# Patient Record
Sex: Male | Born: 1970
Health system: Southern US, Community
[De-identification: ages and names within clinical notes are randomized; demographics above are authoritative.]

## PROBLEM LIST (undated history)

## (undated) DIAGNOSIS — T8859XA Other complications of anesthesia, initial encounter: Secondary | ICD-10-CM

## (undated) DIAGNOSIS — F909 Attention-deficit hyperactivity disorder, unspecified type: Secondary | ICD-10-CM

## (undated) DIAGNOSIS — E039 Hypothyroidism, unspecified: Secondary | ICD-10-CM

## (undated) DIAGNOSIS — C801 Malignant (primary) neoplasm, unspecified: Secondary | ICD-10-CM

## (undated) DIAGNOSIS — M1990 Unspecified osteoarthritis, unspecified site: Secondary | ICD-10-CM

## (undated) DIAGNOSIS — G4730 Sleep apnea, unspecified: Secondary | ICD-10-CM

## (undated) DIAGNOSIS — F419 Anxiety disorder, unspecified: Secondary | ICD-10-CM

## (undated) HISTORY — PX: THYROIDECTOMY: SHX17

## (undated) HISTORY — PX: EYE SURGERY: SHX253

## (undated) HISTORY — PX: PROSTATE SURGERY: SHX751

---

## 2005-07-04 HISTORY — PX: THYROIDECTOMY: SHX17

## 2012-07-23 ENCOUNTER — Inpatient Hospital Stay: Admit: 2012-07-23 | Discharge: 2012-07-23 | Attending: Emergency Medicine

## 2012-07-23 MED ORDER — PREDNISONE 10 MG PO TABS
10 MG | ORAL_TABLET | Freq: Every day | ORAL | Status: AC
Start: 2012-07-23 — End: 2012-07-28

## 2012-07-23 MED ORDER — AZITHROMYCIN 250 MG PO TABS
250 MG | PACK | ORAL | Status: DC
Start: 2012-07-23 — End: 2013-03-06

## 2012-07-23 MED ORDER — ALBUTEROL SULFATE HFA 108 (90 BASE) MCG/ACT IN AERS
108 (90 Base) MCG/ACT | Freq: Four times a day (QID) | RESPIRATORY_TRACT | Status: DC | PRN
Start: 2012-07-23 — End: 2013-03-21

## 2012-07-23 MED ADMIN — predniSONE (DELTASONE) tablet 60 mg: ORAL | @ 21:00:00 | NDC 00143973805

## 2012-07-23 MED ADMIN — azithromycin (ZITHROMAX) tablet 500 mg: ORAL | @ 13:00:00 | NDC 59762306003

## 2012-07-23 MED ADMIN — ipratropium-albuterol (DUONEB) nebulizer solution 1 ampule: RESPIRATORY_TRACT | @ 21:00:00 | NDC 00185732230

## 2012-07-23 MED FILL — PREDNISONE 20 MG PO TABS: 20 MG | ORAL | Qty: 3

## 2012-07-23 MED FILL — DUONEB 0.5-2.5 (3) MG/3ML IN SOLN: RESPIRATORY_TRACT | Qty: 3

## 2012-07-23 MED FILL — AZITHROMYCIN 250 MG PO TABS: 250 MG | ORAL | Qty: 2

## 2012-07-23 NOTE — ED Notes (Signed)
To conference room. Radiology aware.    Fulton Mole, RN  07/23/12 [phone]

## 2012-07-23 NOTE — ED Notes (Signed)
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 States family sick past 3- 4 months, states otc meds no relief no other complaints.    Gabriel Perez  07/23/12 1345

## 2012-07-23 NOTE — ED Notes (Signed)
Discharge instructions and prescriptions reviewed and provided to pt. VU an understanding, denies any questions. Discharged at this time, ambulatory, with no distress noted.      Fulton Mole, RN  07/23/12 [phone]

## 2012-07-23 NOTE — ED Provider Notes (Signed)
Gabriel Perez is a 42 y.o. male who presents to the emergency department with c/o   Location: cough, dry  Duration: 4-5 days  Severity: frequent, worsening  Exacerbating factors: activity  Alleviating factors: tried nothing  Associated factors: reports exertional shortness of breath, wheezing and body aches.  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  He states his shortness of breath and wheezing started then. He states his mother-in-law was recently sick with a cough.  He denies history of asthma or COPD.  Patient does not smoke.  He has history of sleep apnea however he does not use a machine.  Has history of thyroid cancer as well.    PULMONARY EMBOLISM RULE-OUT CRITERIA:    1. Age > 49? No  2. Pulse greater than 99/min?  No  3. Room air pulse ox <95%?  No  4. Hemoptysis?  No  5. On estrogen?  No  6. Prior diagnosis of DVT or PE?  No  7. Surgery or trauma requiring endotracheal intubation or hospitalization in past 4 wks?  No  8. Unilateral leg swelling?  No    The history is provided by the patient.       Review of Systems   Constitutional: Negative for fever, chills and fatigue.   HENT: Negative for congestion, sore throat, rhinorrhea, sneezing, trouble swallowing, neck pain, postnasal drip and tinnitus.    Eyes: Negative for visual disturbance.   Respiratory: Positive for cough, shortness of breath and wheezing. Negative for chest tightness.    Cardiovascular: Negative for chest pain and palpitations.   Gastrointestinal: Negative for nausea, vomiting and abdominal pain.   Genitourinary: Negative for dysuria.   Musculoskeletal: Positive for myalgias. Negative for back pain.   Skin: Negative.    Neurological: Negative for dizziness, syncope, weakness, numbness and headaches.   All other systems reviewed and are negative.      PAST MEDICAL HISTORY    has no past medical history on file.    PAST SURGICAL HISTORY   has no past surgical history on file.    FAMILY HISTORY  family history is not on file.    SOCIAL HISTORY       HOME MEDICATIONS     Prior to Admission medications    Not on File        ALLERGIES   has no known allergies.     Physical Exam   Nursing note and vitals reviewed.  Constitutional: He is oriented to person, place, and time. He appears well-developed and well-nourished.   HENT:   Head: Normocephalic and atraumatic. No trismus in the jaw.   Right Ear: Hearing, tympanic membrane, external ear and ear canal normal.   Left Ear: Hearing, tympanic membrane, external ear and ear canal normal.   Nose: Nose normal. No rhinorrhea. Right sinus exhibits no maxillary sinus tenderness and no frontal sinus tenderness. Left sinus exhibits no maxillary sinus tenderness and no frontal sinus tenderness.   Mouth/Throat: Uvula is midline, oropharynx is clear and moist and mucous membranes are normal. No uvula swelling.   Eyes: EOM are normal. Pupils are equal, round, and reactive to light.   Neck: Normal range of motion and full passive range of motion without pain. Neck supple. No spinous process tenderness and no muscular tenderness present. No rigidity. No edema, no erythema and normal  range of motion present.   Cardiovascular: Normal rate, regular rhythm and normal heart sounds.  Exam reveals no gallop and no friction rub.    No murmur heard.  Pulses:       Radial pulses are 2+ on the right side, and 2+ on the left side.        Dorsalis pedis pulses are 2+ on the right side, and 2+ on the left side.   Pulmonary/Chest: Effort normal. No respiratory distress. He has no decreased breath sounds. He has wheezes. He has rhonchi. He has no rales. He exhibits no tenderness.   Abdominal: Soft. Bowel sounds are normal. There is no tenderness.   Musculoskeletal: Normal range of motion. He exhibits no tenderness.   Lymphadenopathy:     He has no cervical adenopathy.    Neurological: He is alert and oriented to person, place, and time. He exhibits normal muscle tone. Coordination normal.   Skin: Skin is warm and dry. No rash noted.   Psychiatric: He has a normal mood and affect. His behavior is normal. Judgment and thought content normal.       BP 125/88   Pulse 89   Temp(Src) 98.8 ??F (37.1 ??C) (Oral)   Resp 16   Ht 6\' 2"  (1.88 m)   Wt 250 lb (113.399 kg)   BMI 32.08 kg/m2   SpO2 96%     Procedures    MDM    Medications   ipratropium-albuterol (DUONEB) nebulizer solution 1 ampule (1 ampule Inhalation Given 07/23/12 1543)   predniSONE (DELTASONE) tablet 60 mg (60 mg Oral Given 07/23/12 1543)   azithromycin (ZITHROMAX) tablet 500 mg (500 mg Oral Given 07/23/12 0800)       Labs      Radiology  Preliminary x-ray interpretation by Jackson Latino, MD and myself   independently, in absence of radiologist (Final interpretation by radiologist to follow):    Chest: was negative for infiltrate, effusion, pneumothorax, or wide mediastinum      EKG Interpretation.      Clinical Impression:  Bronchitis    Reevaluation: Lung sounds clear.  Respirations are easy and even.  Patient states that he is breathing much easier.    I discussed the x-ray results with the patient. He does not appear at this time.  Based on his symptoms of illness he will be discharged home with  Zithromax.  He is wheezing so he'll be discharged with prednisone and albuterol inhaler as well.  He is to follow up with PCP on call in one week. The patient is comfortable and agreeable to this plan of care.  I have answered patient's questions.  The patient has no further questions at this time.    I discussed the patient with Dr. Beatrice Lecher, and we are in agreement on the plan of care.    I have considered all the elements of the relevant differential diagnosis. I estimate there is LOW risk for PNEUMONIA, MENINGITIS, PERITONSILLAR ABSCESS, SEPSIS, MALIGNANT OTITIS EXTERNA, OR EPIGLOTTITIS thus I consider the discharge disposition  reasonable.  The patient and/or family and I have discussed the diagnosis and risks, and we agree with discharging home to follow-up with their primary doctor. We also discussed returning to the Emergency Department immediately if new or worsening symptoms occur. We have discussed the symptoms which are most concerning (e.g., changing or worsening breathing, vomiting, confusion, weakness, severe headache) that necessitate immediate return.    Akshay Spang is well appearing, non-toxic, and afebrile at the time  of discharge.     Please note that some or all of this chart was generated using [employer] voice recognition software. Although every effort was made to ensure the accuracy of this automated transcription, some errors in transcription may have occurred.       Gardiner Barefoot, [place]  07/23/12 [id number]

## 2012-07-23 NOTE — Discharge Instructions (Signed)
IMPORTANT:  If you have any trouble getting in to see the physician that we have referred you to today, please call the [facility] at [phone].  Please leave a voice message if they are unavailable and they will return your call.    If you were prescribed an outpatient test, please call [facility] at [phone] to schedule an appointment for your test that was ordered.       ADDITIONAL INSTRUCTIONS FOR ALL PATIENTS:  -If you have been prescribed an antibiotic TAKE IT AS DIRECTED UNTIL IT IS ALL FINISHED.  -If you HAVE RECEIVED OR BEEN PRESCRIBED A MEDICATION THAT MAY CAUSE DROWSINESS. DO NOT DRIVE, DRINK ALCOHOL, OR OPERATE MACHINERY THAT REQUIRES YOU TO BE ALERT.    -If you had an EKG and/or X-Ray reading made in the Emergency Department, it will be reviewed by a Cardiologist and/or Radiologist. If the review changes your diagnosis, you will be contacted.  -If you had a specimen collected for culture, a CULTURE REPORT takes 48-72 hours to generate: You will be contacted if a change in treatment is needed.    -Return if your condition worsens or if you have severe pain, worsening of symptoms such as fever, vomiting or difficulty breathing.    If you have been prescribed an opiate or other controlled substance (see lists below):    [place] may have been prescribed a medication that is a controlled substance.  Controlled substances include pain medications known as opiates and sedative nerve medications known as benzodiazepines.    Some common opiates include:    Codeine (such as Tylenol #3)  Hydrocodone (Vicodin, Lortab, Lorcet, Norco)  Oxycodone (Percocet, Percodan, Oxycodone, Oxy IR)    Some common benzodiazepines include:    Diazepam (Valium)  Lorazepam (Ativan)  Alprazolam (Xanax)  Clonazepam (Klonopin)  Oxazepam (Serax)    All of these controlled substances are highly addictive and frequently abused.  Misuse can and frequently does lead  to addiction as well as overdose and death.  Short term supplies, 3 days or less, are prescribed because of the highly addictive nature of the medication.  Any of the controlled substance medication NOT taken should be disposed of properly and NOT SAVED.  The recommended method of disposing of unused medications is:       Place the medicines in a sealable plastic bag.  If the medicine is a solid, crush it or add water to dissolve it.    Add something undesirable (cat litter, coffee grounds, etc.)    Dispose of sealed bag in household trash    Do not flush or pour unused medicines down a sink or drain.    Also, because of the addictive nature and frequent abuse, these medications are sometimes stolen.  These medications should be kept in a safe place where they cannot be stolen.  Do not keep them in your car or purse.  Lost or stolen prescriptions for controlled substances WILL NOT BE REFILLED in this emergency department, regardless of whether a police report was filed.        Antibiotic Medication  Antibiotics are among the most frequently prescribed medicines. Antibiotics cure illness by assisting our body to injure or kill the bacteria that cause infection. While antibiotics are useful to treat a wide variety of infections they are useless against viruses. Antibiotics cannot cure colds, flu, or other viral infections.   There are many types of antibiotics available. Your caregiver will  decide which antibiotic will be useful for an illness. Never take or give someone else's antibiotics or left over medicine.  Your caregiver may also take into account:   Allergies.   The cost of the medicine.   Dosing schedules.   Taste.   Common side effects when choosing an antibiotic for an infection.  Ask your caregiver if you have questions about why a certain medicine was chosen.  HOME CARE INSTRUCTIONS  Read all instructions and labels on medicine bottles carefully. Some antibiotics should be taken on an empty stomach  while others should be taken with food. Taking antibiotics incorrectly may reduce how well they work. Some antibiotics need to be kept in the refrigerator. Others should be kept at room temperature. Ask your caregiver or pharmacist if you do not understand how to give the medicine.  Be sure to give the amount of medicine your caregiver has prescribed. Even if you feel better and your symptoms improve, bacteria may still remain alive in the body. Taking all of the medicine will prevent:   The infection from returning and becoming harder to treat.   Complications from partially treated infections.  If there is any medicine left over after you have taken the medicine as your caregiver has instructed, throw the medicine away.  Be sure to tell your caregiver if you:   Are allergic to any medicines.   Are pregnant or intend to become pregnant while using this medicine.   Are breastfeeding.   Are taking any other prescription, non-prescription medicine, or herbal remedies.   Have any other medical conditions or problems you have not already discussed.  If you are taking birth control pills, they may not work while you are on antibiotics. To avoid unwanted pregnancy:   Continue taking your birth control pills as usual.   Use a second form of birth control (such as condoms) while you are taking antibiotic medicine.   When you finish taking the antibiotic medicine, continue using the second form of birth control until you are finished with your current 1 month cycle of birth control pills.  Try not to miss any doses of medicine. If you miss a dose, take it as soon as possible. However, if it is almost time for the next dose and the dosing schedule is:   2 doses a day, take the missed dose and the next dose 5 to 6 hours apart.   3 or more doses a day, take the missed dose and the next dose 2 to 4 hours apart, then go back to the normal schedule.   If you are unable to make up a missed dose, take the next scheduled  dose on time and complete the missed dose at the end of the prescribed time for your medicine.  SIDE EFFECTS TO TAKING ANTIBIOTICS  Common side effects to antibiotic use include:   Soft stools or diarrhea.   Mild stomach upset.   Sun sensitivity.  SEEK MEDICAL CARE IF:    If you get worse or do not improve within a few days of starting the medicine.   Vomiting develops.   Diaper rash or rash on the genitals appears.   Vaginal itching occurs.   White patches appear on the tongue or in the mouth.   Severe watery diarrhea and abdominal cramps occur.   Signs of an allergy develop (trouble breathing, wheezing, hives, unknown itchy rash appears, swelling of the lips, face or tongue, fainting, or blisters on the skin or in  the mouth). STOP TAKING THE ANTIBIOTIC.  SEEK IMMEDIATE MEDICAL CARE IF:    Urine turns dark or blood colored.   Skin turns yellow.   Easy bruising or bleeding occurs.   Joint pain or muscle aches occur.   Fever returns.   Severe headache occurs.  Document Released: 03/02/2004 Document Revised: 09/12/2011 Document Reviewed: 03/12/2009      Pneumonia, Adult  Pneumonia is an infection of the lungs.   CAUSES  Pneumonia may be caused by bacteria or a virus. Usually, these infections are caused by breathing infectious particles into the lungs (respiratory tract).  SYMPTOMS    Cough.   Fever.   Chest pain.   Increased rate of breathing.   Wheezing.   Mucus production.  DIAGNOSIS   If you have the common symptoms of pneumonia, your caregiver will typically confirm the diagnosis with a chest X-ray. The X-ray will show an abnormality in the lung (pulmonary infiltrate) if you have pneumonia. Other tests of your blood, urine, or sputum may be done to find the specific cause of your pneumonia. Your caregiver may also do tests (blood gases or pulse oximetry) to see how well your lungs are working.  TREATMENT   Some forms of pneumonia may be spread to other people when you cough or sneeze. You  may be asked to wear a mask before and during your exam. Pneumonia that is caused by bacteria is treated with antibiotic medicine. Pneumonia that is caused by the influenza virus may be treated with an antiviral medicine. Most other viral infections must run their course. These infections will not respond to antibiotics.   PREVENTION  A pneumococcal shot (vaccine) is available to prevent a common bacterial cause of pneumonia. This is usually suggested for:   People over 21 years old.   Patients on chemotherapy.   People with chronic lung problems, such as bronchitis or emphysema.   People with immune system problems.  If you are over 65 or have a high risk condition, you may receive the pneumococcal vaccine if you have not received it before. In some countries, a routine influenza vaccine is also recommended. This vaccine can help prevent some cases of pneumonia.You may be offered the influenza vaccine as part of your care.  If you smoke, it is time to quit. You may receive instructions on how to stop smoking. Your caregiver can provide medicines and counseling to help you quit.  HOME CARE INSTRUCTIONS    Cough suppressants may be used if you are losing too much rest. However, coughing protects you by clearing your lungs. You should avoid using cough suppressants if you can.   Your caregiver may have prescribed medicine if he or she thinks your pneumonia is caused by a bacteria or influenza. Finish your medicine even if you start to feel better.   Your caregiver may also prescribe an expectorant. This loosens the mucus to be coughed up.   Only take over-the-counter or prescription medicines for pain, discomfort, or fever as directed by your caregiver.   Do not smoke. Smoking is a common cause of bronchitis and can contribute to pneumonia. If you are a smoker and continue to smoke, your cough may last several weeks after your pneumonia has cleared.   A cold steam vaporizer or humidifier in your room or  home may help loosen mucus.   Coughing is often worse at night. Sleeping in a semi-upright position in a recliner or using a couple pillows under your head will help  with this.   Get rest as you feel it is needed. Your body will usually let you know when you need to rest.  SEEK IMMEDIATE MEDICAL CARE IF:    Your illness becomes worse. This is especially true if you are elderly or weakened from any other disease.   You cannot control your cough with suppressants and are losing sleep.   You begin coughing up blood.   You develop pain which is getting worse or is uncontrolled with medicines.   You have a fever.   Any of the symptoms which initially brought you in for treatment are getting worse rather than better.   You develop shortness of breath or chest pain.  MAKE SURE YOU:    Understand these instructions.   Will watch your condition.   Will get help right away if you are not doing well or get worse.  Document Released: 06/20/2005 Document Revised: 09/12/2011 Document Reviewed: 09/09/2010  [facility] Patient Information 2013 [place], [place].      [employer] Patient Information 2013 [place], [place].

## 2012-07-23 NOTE — ED Notes (Signed)
The patient was seen by me in conjunction with a mid-level provider (nurse practitioner or physician assistant).  I have personally performed and/or participated in the history, exam and medical decision making and agree with all pertinent clinical information.  I have also reviewed and agree with the past medical, family and social history unless otherwise noted. All lab results, EKG tracings, and radiographic studies or interpretations were reviewed by me.    I have personally performed a face-to-face diagnostic evaluation on the patient.  My findings are as follows:    History:  Patient presents to the ED with a chief complaint of URI. 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 He reports sick contacts at home.      Exam shows:  Diffuse wheezing. Normal heart sounds. alert, oriented, nontoxic.abdomen soft nontender. Oropharynx clear. Well-appearing nontoxic.    This document serves as a record of the decisions personally performed by the attending provider, Dr. Jackson Latino, MD. It was created on their behalf by Lucy Chris, a trained medical scribe. The creation of this document is based on the provider's statements to the medical scribe.    This dictation was generated by voice recognition computer software. In addition, part of the chart was also generated automatically by the EMR . Although all attempts are made to edit the dictation for accuracy, there may be errors in the transcription and/or computer generation that were not intended.  Scribe Authentication: All medical record entries made by the scribe were at my direction. I have reviewed the chart and agree that the record accurately reflects the my work and the decisions made by me, Linus Galas M.D.      Jackson Latino, MD  07/23/12 [phone]

## 2013-03-06 MED ORDER — LEVOTHYROXINE SODIUM 175 MCG PO TABS
175 MCG | ORAL_TABLET | Freq: Every day | ORAL | Status: DC
Start: 2013-03-06 — End: 2013-03-22

## 2013-03-06 NOTE — Progress Notes (Signed)
Subjective:      Patient ID: Gabriel Perez is a 42 y.o. male.    Other  This is a new problem. The current episode started today (New to area and ran out of Synthroid yesterday. Requesting med refill until appt. with new pcp on 03/22/13. No complaints.). Pertinent negatives include no abdominal pain, arthralgias, change in bowel habit, chest pain, chills, congestion, coughing, fatigue, fever, headaches, myalgias, nausea, numbness, rash, sore throat, vomiting or weakness.       Review of Systems   Constitutional: Negative for fever, chills and fatigue.   HENT: Negative for ear pain, congestion, sore throat, rhinorrhea and trouble swallowing.    Eyes: Negative for pain and discharge.   Respiratory: Negative for cough, shortness of breath and wheezing.    Cardiovascular: Negative for chest pain.   Gastrointestinal: Negative for nausea, vomiting, abdominal pain, diarrhea and change in bowel habit.   Genitourinary: Negative for dysuria, urgency, frequency and difficulty urinating.   Musculoskeletal: Negative for myalgias and arthralgias.   Skin: Negative for rash and wound.   Neurological: Negative for weakness, numbness and headaches.   Hematological: Does not bruise/bleed easily.   All other systems reviewed and are negative.        Objective:   Physical Exam   Constitutional: He is oriented to person, place, and time. He appears well-developed and well-nourished. No distress.   HENT:   Head: Normocephalic and atraumatic.   Right Ear: Tympanic membrane, external ear and ear canal normal.   Left Ear: Tympanic membrane, external ear and ear canal normal.   Mouth/Throat: Oropharynx is clear and moist. No oropharyngeal exudate.   Eyes: Conjunctivae and EOM are normal. Pupils are equal, round, and reactive to light. No scleral icterus.   Neck: Normal range of motion. Neck supple. No tracheal deviation present. No thyromegaly present.   Cardiovascular: Normal rate, regular rhythm and normal heart sounds.  Exam reveals no  gallop and no friction rub.    No murmur heard.  Pulmonary/Chest: Effort normal and breath sounds normal. No respiratory distress. He has no wheezes. He has no rales. He exhibits no tenderness.   Abdominal: Soft. Bowel sounds are normal. He exhibits no distension and no mass. There is no tenderness. There is no rebound and no guarding.   Musculoskeletal: Normal range of motion. He exhibits no edema or tenderness.   Lymphadenopathy:     He has no cervical adenopathy.   Neurological: He is alert and oriented to person, place, and time. He has normal strength. No sensory deficit.   Skin: Skin is warm and dry. No lesion and no rash noted.   Psychiatric: He has a normal mood and affect. His behavior is normal.   Vitals reviewed.      Assessment:      1. Thyroid cancer (HCC)    2. Medication refill    3. S/P thyroidectomy               Plan:      [place] was seen today for medication problem.    Diagnoses and associated orders for this visit:    Thyroid cancer (HCC)    Medication refill    S/P thyroidectomy    Other Orders  - levothyroxine (LEVOTHROID) 175 MCG tablet; Take 1 tablet by mouth Daily.

## 2013-03-21 MED ORDER — SYNTHROID 175 MCG PO TABS
175 MCG | ORAL_TABLET | Freq: Every day | ORAL | Status: DC
Start: 2013-03-21 — End: 2013-06-04

## 2013-03-21 NOTE — Progress Notes (Signed)
Subjective:      Patient ID: Gabriel Perez is a 42 y.o. male.    HPI: 42 y.o. in for   Chief Complaint   Patient presents with   ??? Establish Care     moved here from [facility], seen only 1x or Urgent Care    well visit: doing well no issues  Trying to exercise, eats well    Needs refills.  Hx of papillary thyroid cancer.  S/p excision.  Been a while since his last tsh check.  On synthroid.  Working out, gaining weight (slowly over time) but has been having a hard time losing weight.          Past Medical History   Diagnosis Date   ??? Thyroid cancer (HCC) 2007   ??? H/O thyroidectomy 2007   ??? Lipoma        Current Outpatient Prescriptions   Medication Sig Dispense Refill   ??? levothyroxine (LEVOTHROID) 175 MCG tablet Take 1 tablet by mouth Daily.  30 tablet  0     No current facility-administered medications for this visit.       No Known Allergies    Past Surgical History   Procedure Laterality Date   ??? Thyroidectomy         Family History   Problem Relation Age of Onset   ??? Asthma Maternal Grandmother    ??? Heart Disease Maternal Grandmother    ??? Cancer Maternal Grandfather      Lung, smoker       History     Social History   ??? Marital Status: Married     Spouse Name: N/A     Number of Children: N/A   ??? Years of Education: N/A     Occupational History   ??? Not on file.     Social History Main Topics   ??? Smoking status: Never Smoker    ??? Smokeless tobacco: Never Used   ??? Alcohol Use: No      Comment: maybe 1 beer every few months    ??? Drug Use: No   ??? Sexual Activity: Not on file     Other Topics Concern   ??? 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   HENT: Negative for hearing loss, ear pain and postnasal drip.    Eyes: Negative for discharge.   Respiratory: Negative for cough, chest tightness and shortness of breath.     Cardiovascular: Negative for chest pain and palpitations.   Gastrointestinal: Negative for abdominal pain, diarrhea, constipation, blood in stool, abdominal distention and anal bleeding.   Genitourinary: Negative for dysuria and difficulty urinating.   Musculoskeletal: Negative.    Skin: Negative for rash.       Objective:   Physical Exam   Constitutional: He appears well-developed and well-nourished. No distress.   HENT:   Head: Normocephalic and atraumatic.   Mouth/Throat: No oropharyngeal exudate.   Eyes: Conjunctivae and EOM are normal. Right eye exhibits no discharge. Left eye exhibits no discharge. No scleral icterus.   Neck: Normal range of motion. Neck supple. No tracheal deviation present. No thyromegaly present.   Cardiovascular: Normal rate, regular rhythm and normal heart sounds.  Exam reveals no gallop and no friction rub.    No murmur heard.  Pulmonary/Chest: Effort normal and breath sounds normal. No respiratory distress. He has no wheezes. He has no rales. He exhibits no tenderness.   Abdominal: Soft. Bowel sounds are normal. He exhibits no distension and no mass. There is no tenderness. There is no rebound and no guarding.   Lymphadenopathy:     He has no cervical adenopathy.   Skin: No rash noted. He is not diaphoretic.   Nursing note and vitals reviewed.      Assessment:      Well visit      Plan:       Healthy living advice given  bp looks good  Work on weight loss (check tsh, adjust synthroid if needed)    Gabriel Perez was seen today for establish care.    Diagnoses and associated orders for this visit:    Well adult exam  - LIPID PANEL  - Comprehensive Metabolic Panel    Hypothyroidism  - TSH without Reflex    Need for Tdap vaccination  - Tdap vaccine greater than or equal to 7yo IM    Other Orders  - SYNTHROID 175 MCG tablet; Take 1 tablet by mouth Daily.

## 2013-03-22 LAB — LIPID PANEL
Cholesterol, Total: 197 mg/dL (ref 0–199)
HDL: 39 mg/dL — ABNORMAL LOW (ref 40–60)
LDL Calculated: 128 mg/dL — ABNORMAL HIGH (ref <100)
Triglycerides: 151 mg/dL — ABNORMAL HIGH (ref 0–150)
VLDL Cholesterol Calculated: 30 mg/dL

## 2013-03-22 LAB — COMPREHENSIVE METABOLIC PANEL
ALT: 29 U/L (ref 10–40)
AST: 21 U/L (ref 15–37)
Albumin/Globulin Ratio: 2.3 — ABNORMAL HIGH (ref 1.1–2.2)
Albumin: 4.8 g/dL (ref 3.4–5.0)
Alkaline Phosphatase: 71 U/L (ref 40–129)
BUN: 14 mg/dL (ref 7–20)
CO2: 25 mmol/L (ref 21–32)
Calcium: 9.5 mg/dL (ref 8.3–10.6)
Chloride: 103 mmol/L (ref 99–110)
Creatinine: 1 mg/dL (ref 0.9–1.3)
GFR African American: >60 (ref >60)
GFR Non-African American: >60 (ref >60)
Globulin: 2.1 g/dL
Glucose: 95 mg/dL (ref 70–99)
Potassium: 4.1 mmol/L (ref 3.5–5.1)
Sodium: 140 mmol/L (ref 136–145)
Total Bilirubin: 0.7 mg/dL (ref 0.0–1.0)
Total Protein: 6.9 g/dL (ref 6.4–8.2)

## 2013-03-22 LAB — TSH: TSH: 7.51 u[IU]/mL — ABNORMAL HIGH (ref 0.27–4.20)

## 2013-03-22 MED ORDER — SYNTHROID 200 MCG PO TABS
200 MCG | ORAL_TABLET | Freq: Every day | ORAL | Status: DC
Start: 2013-03-22 — End: 2013-07-18

## 2013-06-04 MED ORDER — TRIAMCINOLONE ACETONIDE 0.1 % EX OINT
0.1 % | CUTANEOUS | Status: AC
Start: 2013-06-04 — End: 2013-06-11

## 2013-06-04 MED ORDER — PREDNISONE 10 MG PO TABS
10 MG | ORAL_TABLET | ORAL | Status: AC
Start: 2013-06-04 — End: ?

## 2013-06-04 MED ADMIN — methylPREDNISolone sodium (SOLU-MEDROL) injection 40 mg: INTRAVENOUS | @ 18:00:00 | NDC 00009003930

## 2013-06-04 NOTE — Progress Notes (Signed)
Subjective:      Patient ID: Gabriel Perez is a 42 y.o. male.    HPI Comments: Pt burned limbs on tree waste/leaves 1 week ago    Pt is very allergic to poison ivy    Rash  This is a new problem. The current episode started in the past 7 days. The problem has been gradually worsening since onset. The rash is diffuse. The rash is characterized by blistering, itchiness and redness. He was exposed to plant contact. Pertinent negatives include no congestion, cough, diarrhea, eye pain, facial edema, fatigue, fever, joint pain, nail changes, rhinorrhea, shortness of breath, sore throat or vomiting. Past treatments include nothing. The treatment provided no relief. There is no history of allergies, asthma, eczema or varicella.       Review of Systems   Constitutional: Negative for fever and fatigue.   HENT: Negative for congestion, rhinorrhea and sore throat.    Eyes: Negative for pain.   Respiratory: Negative for cough and shortness of breath.    Cardiovascular: Negative for chest pain.   Gastrointestinal: Negative for vomiting and diarrhea.   Musculoskeletal: Negative for joint pain.   Skin: Positive for rash. Negative for nail changes.       Objective:   Physical Exam   Constitutional: He appears well-developed and well-nourished. No distress.   HENT:   Head: Normocephalic and atraumatic.   Mouth/Throat: No oropharyngeal exudate.   Eyes: Conjunctivae and EOM are normal. Right eye exhibits no discharge. Left eye exhibits no discharge. No scleral icterus.   Neck: Normal range of motion. Neck supple. No tracheal deviation present. No thyromegaly present.   Cardiovascular: Normal rate, regular rhythm and normal heart sounds.  Exam reveals no gallop and no friction rub.    No murmur heard.  Pulmonary/Chest: Effort normal and breath sounds normal. No respiratory distress. He has no wheezes. He has no rales. He exhibits no tenderness.   Lungs CTAB   Lymphadenopathy:     He has no cervical adenopathy.   Skin: Rash (diffuse  vesicular rash.) noted. He is not diaphoretic.   Nursing note and vitals reviewed.      Assessment:      Poison ivy       Plan:      Don't be involved with burning yard waste any more.    Steroids (solumedrol x 1 today, then prednisone taper; topical triamcinolone prn)  Benadryl as needed    Franky was seen today for rash.    Diagnoses and associated orders for this visit:    Poison ivy dermatitis    Hypothyroid  - TSH with Reflex    Other Orders  - triamcinolone (KENALOG) 0.1 % ointment; Apply topically 2 times daily.  - predniSONE (DELTASONE) 10 MG tablet; Take 4 tabs po daily x 3 days, then 3 tabs po daily x 3 days, then 2 tabs po daily x 3 days, then 1 tab po daily x 3 days, then stop.  - methylPREDNISolone sodium (SOLU-MEDROL) injection 40 mg; Infuse 1 mL intravenously once.

## 2013-06-05 LAB — TSH WITH REFLEX: TSH: 0.42 u[IU]/mL (ref 0.27–4.20)

## 2013-07-18 NOTE — Telephone Encounter (Signed)
Last Seen: 06/04/13

## 2013-07-23 MED ORDER — SYNTHROID 200 MCG PO TABS
200 MCG | ORAL_TABLET | ORAL | Status: DC
Start: 2013-07-23 — End: 2013-10-19

## 2013-10-21 NOTE — Telephone Encounter (Signed)
Last OV 06/04/13  Last tsh 06/04/13

## 2013-10-22 MED ORDER — SYNTHROID 200 MCG PO TABS
200 MCG | ORAL_TABLET | ORAL | Status: DC
Start: 2013-10-22 — End: 2013-11-28

## 2013-11-28 NOTE — Telephone Encounter (Signed)
Last seen 06/04/13  Last labs 06/04/13

## 2013-11-30 MED ORDER — SYNTHROID 200 MCG PO TABS
200 MCG | ORAL_TABLET | Freq: Every day | ORAL | Status: DC
Start: 2013-11-30 — End: 2014-04-14

## 2014-04-14 MED ORDER — SYNTHROID 200 MCG PO TABS
200 MCG | ORAL_TABLET | ORAL | Status: DC
Start: 2014-04-14 — End: 2014-06-11

## 2014-04-14 NOTE — Telephone Encounter (Signed)
Last seen 06/04/13

## 2014-06-11 MED ORDER — SYNTHROID 200 MCG PO TABS
200 MCG | ORAL_TABLET | Freq: Every day | ORAL | Status: DC
Start: 2014-06-11 — End: 2014-07-12

## 2014-06-11 NOTE — Telephone Encounter (Signed)
Verbal ok per Johnston. Must make appointment before any further refills.

## 2014-07-14 MED ORDER — SYNTHROID 200 MCG PO TABS
200 MCG | ORAL_TABLET | ORAL | Status: DC
Start: 2014-07-14 — End: 2014-09-06

## 2014-07-14 NOTE — Telephone Encounter (Signed)
Verbal ok per Johnston. Must make appointment before any further refills.

## 2014-09-08 MED ORDER — SYNTHROID 200 MCG PO TABS
200 MCG | ORAL_TABLET | Freq: Every day | ORAL | Status: AC
Start: 2014-09-08 — End: ?

## 2014-09-08 NOTE — Telephone Encounter (Signed)
Verbal ok per Johnston. Must make appointment before any further refills.

## 2018-08-01 DIAGNOSIS — G4733 Obstructive sleep apnea (adult) (pediatric): Secondary | ICD-10-CM | POA: Diagnosis not present

## 2018-08-14 DIAGNOSIS — G4733 Obstructive sleep apnea (adult) (pediatric): Secondary | ICD-10-CM | POA: Diagnosis not present

## 2018-08-16 DIAGNOSIS — J111 Influenza due to unidentified influenza virus with other respiratory manifestations: Secondary | ICD-10-CM | POA: Diagnosis not present

## 2018-09-11 DIAGNOSIS — M25561 Pain in right knee: Secondary | ICD-10-CM | POA: Diagnosis not present

## 2018-09-11 DIAGNOSIS — M25461 Effusion, right knee: Secondary | ICD-10-CM | POA: Diagnosis not present

## 2018-09-12 DIAGNOSIS — G4733 Obstructive sleep apnea (adult) (pediatric): Secondary | ICD-10-CM | POA: Diagnosis not present

## 2018-09-14 DIAGNOSIS — M25561 Pain in right knee: Secondary | ICD-10-CM | POA: Insufficient documentation

## 2018-10-13 DIAGNOSIS — G4733 Obstructive sleep apnea (adult) (pediatric): Secondary | ICD-10-CM | POA: Diagnosis not present

## 2018-11-12 DIAGNOSIS — G4733 Obstructive sleep apnea (adult) (pediatric): Secondary | ICD-10-CM | POA: Diagnosis not present

## 2018-11-14 DIAGNOSIS — F320 Major depressive disorder, single episode, mild: Secondary | ICD-10-CM | POA: Diagnosis not present

## 2018-11-14 DIAGNOSIS — E039 Hypothyroidism, unspecified: Secondary | ICD-10-CM | POA: Diagnosis not present

## 2018-12-13 DIAGNOSIS — G4733 Obstructive sleep apnea (adult) (pediatric): Secondary | ICD-10-CM | POA: Diagnosis not present

## 2019-01-08 DIAGNOSIS — F329 Major depressive disorder, single episode, unspecified: Secondary | ICD-10-CM | POA: Diagnosis not present

## 2019-01-08 DIAGNOSIS — F902 Attention-deficit hyperactivity disorder, combined type: Secondary | ICD-10-CM | POA: Diagnosis not present

## 2019-01-12 DIAGNOSIS — G4733 Obstructive sleep apnea (adult) (pediatric): Secondary | ICD-10-CM | POA: Diagnosis not present

## 2019-01-16 DIAGNOSIS — F329 Major depressive disorder, single episode, unspecified: Secondary | ICD-10-CM | POA: Diagnosis not present

## 2019-01-16 DIAGNOSIS — F902 Attention-deficit hyperactivity disorder, combined type: Secondary | ICD-10-CM | POA: Diagnosis not present

## 2019-02-12 DIAGNOSIS — G4733 Obstructive sleep apnea (adult) (pediatric): Secondary | ICD-10-CM | POA: Diagnosis not present

## 2019-02-21 DIAGNOSIS — F902 Attention-deficit hyperactivity disorder, combined type: Secondary | ICD-10-CM | POA: Diagnosis not present

## 2019-02-21 DIAGNOSIS — F329 Major depressive disorder, single episode, unspecified: Secondary | ICD-10-CM | POA: Diagnosis not present

## 2019-03-08 DIAGNOSIS — Z1159 Encounter for screening for other viral diseases: Secondary | ICD-10-CM | POA: Diagnosis not present

## 2019-03-15 DIAGNOSIS — G4733 Obstructive sleep apnea (adult) (pediatric): Secondary | ICD-10-CM | POA: Diagnosis not present

## 2019-04-14 DIAGNOSIS — G4733 Obstructive sleep apnea (adult) (pediatric): Secondary | ICD-10-CM | POA: Diagnosis not present

## 2019-05-09 DIAGNOSIS — R41840 Attention and concentration deficit: Secondary | ICD-10-CM | POA: Diagnosis not present

## 2019-05-09 DIAGNOSIS — M25561 Pain in right knee: Secondary | ICD-10-CM | POA: Diagnosis not present

## 2019-05-15 DIAGNOSIS — G4733 Obstructive sleep apnea (adult) (pediatric): Secondary | ICD-10-CM | POA: Diagnosis not present

## 2019-05-21 DIAGNOSIS — M1711 Unilateral primary osteoarthritis, right knee: Secondary | ICD-10-CM | POA: Diagnosis not present

## 2019-05-21 DIAGNOSIS — M25561 Pain in right knee: Secondary | ICD-10-CM | POA: Diagnosis not present

## 2019-06-07 DIAGNOSIS — F900 Attention-deficit hyperactivity disorder, predominantly inattentive type: Secondary | ICD-10-CM | POA: Diagnosis not present

## 2019-06-13 DIAGNOSIS — F900 Attention-deficit hyperactivity disorder, predominantly inattentive type: Secondary | ICD-10-CM | POA: Diagnosis not present

## 2019-06-13 DIAGNOSIS — E039 Hypothyroidism, unspecified: Secondary | ICD-10-CM | POA: Diagnosis not present

## 2019-07-01 DIAGNOSIS — G4733 Obstructive sleep apnea (adult) (pediatric): Secondary | ICD-10-CM | POA: Diagnosis not present

## 2019-07-04 DIAGNOSIS — G4733 Obstructive sleep apnea (adult) (pediatric): Secondary | ICD-10-CM | POA: Diagnosis not present

## 2019-07-16 DIAGNOSIS — F900 Attention-deficit hyperactivity disorder, predominantly inattentive type: Secondary | ICD-10-CM | POA: Diagnosis not present

## 2019-07-16 DIAGNOSIS — E559 Vitamin D deficiency, unspecified: Secondary | ICD-10-CM | POA: Diagnosis not present

## 2019-07-16 DIAGNOSIS — R252 Cramp and spasm: Secondary | ICD-10-CM | POA: Diagnosis not present

## 2019-07-16 DIAGNOSIS — E039 Hypothyroidism, unspecified: Secondary | ICD-10-CM | POA: Diagnosis not present

## 2019-08-07 DIAGNOSIS — Z13220 Encounter for screening for lipoid disorders: Secondary | ICD-10-CM | POA: Diagnosis not present

## 2019-08-07 DIAGNOSIS — R7309 Other abnormal glucose: Secondary | ICD-10-CM | POA: Diagnosis not present

## 2019-08-07 DIAGNOSIS — R5383 Other fatigue: Secondary | ICD-10-CM | POA: Diagnosis not present

## 2019-08-07 DIAGNOSIS — E559 Vitamin D deficiency, unspecified: Secondary | ICD-10-CM | POA: Diagnosis not present

## 2019-09-17 DIAGNOSIS — M791 Myalgia, unspecified site: Secondary | ICD-10-CM | POA: Diagnosis not present

## 2019-09-17 DIAGNOSIS — R519 Headache, unspecified: Secondary | ICD-10-CM | POA: Diagnosis not present

## 2019-09-17 DIAGNOSIS — M549 Dorsalgia, unspecified: Secondary | ICD-10-CM | POA: Diagnosis not present

## 2019-09-18 ENCOUNTER — Emergency Department (HOSPITAL_COMMUNITY)
Admission: EM | Admit: 2019-09-18 | Discharge: 2019-09-18 | Disposition: A | Discharge status: Home / Self Care | Payer: BC Managed Care – PPO | Attending: Emergency Medicine | Admitting: Emergency Medicine

## 2019-09-18 ENCOUNTER — Other Ambulatory Visit: Payer: Self-pay

## 2019-09-18 ENCOUNTER — Encounter (HOSPITAL_COMMUNITY): Payer: Self-pay | Admitting: Emergency Medicine

## 2019-09-18 ENCOUNTER — Emergency Department (HOSPITAL_COMMUNITY): Payer: BC Managed Care – PPO

## 2019-09-18 DIAGNOSIS — Z20822 Contact with and (suspected) exposure to covid-19: Secondary | ICD-10-CM | POA: Insufficient documentation

## 2019-09-18 DIAGNOSIS — R319 Hematuria, unspecified: Secondary | ICD-10-CM | POA: Diagnosis not present

## 2019-09-18 DIAGNOSIS — Z85850 Personal history of malignant neoplasm of thyroid: Secondary | ICD-10-CM | POA: Diagnosis not present

## 2019-09-18 DIAGNOSIS — R5381 Other malaise: Secondary | ICD-10-CM | POA: Diagnosis not present

## 2019-09-18 DIAGNOSIS — R109 Unspecified abdominal pain: Secondary | ICD-10-CM | POA: Diagnosis not present

## 2019-09-18 DIAGNOSIS — R509 Fever, unspecified: Secondary | ICD-10-CM | POA: Diagnosis not present

## 2019-09-18 DIAGNOSIS — N200 Calculus of kidney: Secondary | ICD-10-CM | POA: Diagnosis not present

## 2019-09-18 HISTORY — DX: Malignant (primary) neoplasm, unspecified: C80.1

## 2019-09-18 LAB — BASIC METABOLIC PANEL
Anion gap: 14 (ref 5–15)
BUN: 10 mg/dL (ref 6–20)
CO2: 24 mmol/L (ref 22–32)
Calcium: 9.3 mg/dL (ref 8.9–10.3)
Chloride: 101 mmol/L (ref 98–111)
Creatinine, Ser: 1.24 mg/dL (ref 0.61–1.24)
GFR calc Af Amer: >60 mL/min (ref >60)
GFR calc non Af Amer: >60 mL/min (ref >60)
Glucose, Bld: 119 mg/dL — ABNORMAL HIGH (ref 70–99)
Potassium: 3.6 mmol/L (ref 3.5–5.1)
Sodium: 139 mmol/L (ref 135–145)

## 2019-09-18 LAB — URINALYSIS, ROUTINE W REFLEX MICROSCOPIC
Bacteria, UA: NONE SEEN
Bilirubin Urine: NEGATIVE
Glucose, UA: NEGATIVE mg/dL
Ketones, ur: NEGATIVE mg/dL
Leukocytes,Ua: NEGATIVE
Nitrite: NEGATIVE
Protein, ur: NEGATIVE mg/dL
Specific Gravity, Urine: 1.002 — ABNORMAL LOW (ref 1.005–1.030)
pH: 8 (ref 5.0–8.0)

## 2019-09-18 LAB — CBC
HCT: 45.7 % (ref 39.0–52.0)
Hemoglobin: 15.4 g/dL (ref 13.0–17.0)
MCH: 28.9 pg (ref 26.0–34.0)
MCHC: 33.7 g/dL (ref 30.0–36.0)
MCV: 85.7 fL (ref 80.0–100.0)
Platelets: 244 10*3/uL (ref 150–400)
RBC: 5.33 MIL/uL (ref 4.22–5.81)
RDW: 12.9 % (ref 11.5–15.5)
WBC: 12.6 10*3/uL — ABNORMAL HIGH (ref 4.0–10.5)
nRBC: 0 % (ref 0.0–0.2)

## 2019-09-18 LAB — RESPIRATORY PANEL BY RT PCR (FLU A&B, COVID)
Influenza A by PCR: NEGATIVE
Influenza B by PCR: NEGATIVE
SARS Coronavirus 2 by RT PCR: NEGATIVE

## 2019-09-18 LAB — TSH: TSH: 0.732 u[IU]/mL (ref 0.350–4.500)

## 2019-09-18 MED ORDER — SODIUM CHLORIDE 0.9 % IV BOLUS
1000.0000 mL | Freq: Once | INTRAVENOUS | Status: AC
  Administered 2019-09-18: 1000 mL via INTRAVENOUS

## 2019-09-18 MED ORDER — CIPROFLOXACIN HCL 500 MG PO TABS: 500.0000 mg | ORAL_TABLET | Freq: Two times a day (BID) | ORAL | 0 refills | Status: DC

## 2019-09-18 MED ORDER — ACETAMINOPHEN 500 MG PO TABS
1000.0000 mg | ORAL_TABLET | Freq: Once | ORAL | Status: AC
  Administered 2019-09-18: 1000 mg via ORAL
  Filled 2019-09-18: qty 2

## 2019-09-18 MED ORDER — CIPROFLOXACIN HCL 500 MG PO TABS
500.0000 mg | ORAL_TABLET | Freq: Once | ORAL | Status: AC
  Administered 2019-09-18: 500 mg via ORAL
  Filled 2019-09-18: qty 1

## 2019-09-18 NOTE — ED Notes (Signed)
Pt resting in bed. Pt denies new or worsening complaints. Will continue to monitor. No distress noted. Pt on continuous monitoring via blood pressure, pulse ox, and cardiac monitor.  

## 2019-09-18 NOTE — ED Notes (Signed)
Pt was discharged from the ED. Pt read and understood discharge paperwork. Pt had vital signs completed. Pt conscious, breathing, and A&Ox4. No distress noted. Pt speaking in complete sentences. Pt ambulated out of the ED with a smooth and steady gait. E-signature not available.  

## 2019-09-18 NOTE — ED Triage Notes (Signed)
C/o bilateral flank pain, body aches, decreased urination, fever, chills, and mild nausea x 2-3 weeks.  Seen at [facility] on Sunday and had negative rapid COVID and hematuria.

## 2019-09-18 NOTE — ED Provider Notes (Signed)
[facility] EMERGENCY DEPARTMENT Provider Note   CSN: [id number] Arrival date & time: 09/18/19  1610     History Chief Complaint  Patient presents with  . Flank Pain  . Hematuria  . Fever    Timothy Villarreal is a 49 y.o. male.  Pt presents to the ED today with malaise and fever.  He has also noticed some hematuria.  Pt did go to urgent care yesterday to get a Covid swab.  It was a rapid Ag test which was negative.  No PCR sent.  Pt does have some flank pain.  He has not taken anything for his fever today.        Past Medical History:  Diagnosis Date  . Cancer [facility])    thyroid cancer    There are no problems to display for this patient.   Past Surgical History:  Procedure Laterality Date  . THYROIDECTOMY         No family history on file.  Social History   Tobacco Use  . Smoking status: Never Smoker  . Smokeless tobacco: Never Used  Substance Use Topics  . Alcohol use: Not Currently  . Drug use: Not Currently    Home Medications Prior to Admission medications   Medication Sig Start Date End Date Taking? Authorizing Provider  ciprofloxacin (CIPRO) 500 MG tablet Take 1 tablet (500 mg total) by mouth 2 (two) times daily. 09/18/19   Isla Pence, MD    Allergies    Patient has no allergy information on record.  Review of Systems   Review of Systems  Constitutional: Positive for chills and fever.  Genitourinary: Positive for hematuria.  Musculoskeletal: Positive for back pain and myalgias.  All other systems reviewed and are negative.   Physical Exam Updated Vital Signs BP 128/72 (BP Location: Right Arm)   Pulse 88   Temp 98.2 F (36.8 C) (Oral)   Resp 18   Ht 6\' 2"  (1.88 m)   Wt 113.4 kg   SpO2 98%   BMI 32.10 kg/m   Physical Exam Vitals and nursing note reviewed.  Constitutional:      Appearance: Normal appearance.  HENT:     Head: Normocephalic and atraumatic.     Right Ear: External ear normal.     Left Ear:  External ear normal.     Nose: Nose normal.     Mouth/Throat:     Mouth: Mucous membranes are dry.  Eyes:     Extraocular Movements: Extraocular movements intact.     Conjunctiva/sclera: Conjunctivae normal.     Pupils: Pupils are equal, round, and reactive to light.  Cardiovascular:     Rate and Rhythm: Normal rate and regular rhythm.     Pulses: Normal pulses.     Heart sounds: Normal heart sounds.  Pulmonary:     Effort: Pulmonary effort is normal.     Breath sounds: Normal breath sounds.  Abdominal:     General: Abdomen is flat. Bowel sounds are normal.     Palpations: Abdomen is soft.  Musculoskeletal:        General: Normal range of motion.     Cervical back: Normal range of motion and neck supple.  Skin:    General: Skin is warm.     Capillary Refill: Capillary refill takes less than 2 seconds.  Neurological:     General: No focal deficit present.     Mental Status: He is alert and oriented to person, place, and time.  Psychiatric:        Mood and Affect: Mood normal.        Behavior: Behavior normal.        Thought Content: Thought content normal.        Judgment: Judgment normal.     ED Results / Procedures / Treatments   Labs (all labs ordered are listed, but only abnormal results are displayed) Labs Reviewed  URINALYSIS, ROUTINE W REFLEX MICROSCOPIC - Abnormal; Notable for the following components:      Result Value   Color, Urine STRAW (*)    Specific Gravity, Urine 1.002 (*)    Hgb urine dipstick MODERATE (*)    All other components within normal limits  BASIC METABOLIC PANEL - Abnormal; Notable for the following components:   Glucose, Bld 119 (*)    All other components within normal limits  CBC - Abnormal; Notable for the following components:   WBC 12.6 (*)    All other components within normal limits  RESPIRATORY PANEL BY RT PCR (FLU A&B, COVID)  URINE CULTURE  TSH    EKG None  Radiology DG Chest Portable 1 View  Result Date:  09/18/2019 CLINICAL DATA:  Fever body EXAM: PORTABLE CHEST 1 VIEW COMPARISON:  None. FINDINGS: The heart size and mediastinal contours are within normal limits. Both lungs are clear. The visualized skeletal structures are unremarkable. IMPRESSION: No active disease. Electronically Signed   By: Donavan Foil M.D.   On: 09/18/2019 17:40   CT Renal Stone Study  Result Date: 09/18/2019 CLINICAL DATA:  Flank pain. Suspected kidney stone. EXAM: CT ABDOMEN AND PELVIS WITHOUT CONTRAST TECHNIQUE: Multidetector CT imaging of the abdomen and pelvis was performed following the standard protocol without IV contrast. Automatic exposure control utilized. COMPARISON:  None. FINDINGS: Lower chest: Minimal subpleural atelectasis. No dependent pleural effusion. Normal heart size. Paucity of coronary calcification. Small hiatal hernia. Hepatobiliary: Mild hepatomegaly. Normal gallbladder and biliary tree. Pancreas: Mild atrophy. No acute pancreatitis. Spleen: Normal. Adrenals/Urinary Tract: Normal right and left adrenal glands. A simple appearing 2.6 cm 4 Hounsfield unit right renal parapelvic cyst. Punctate nonobstructing right renal calculus. Normal appearance of the right ureter. Normal appearances of the left kidney and ureter. Stomach/Bowel: Normal appendix (series 3, image 65). Moderate stool burden and mild diverticulosis coli without bowel obstruction. No gastric, intra colonic wall thickening. Vascular/Lymphatic: No abdominal aorta calcified atherosclerosis or aneurysm. Reproductive: Normal. Other: Small bilateral direct inguinal and tiny periumbilical herniations of fat without strangulation or incarceration. Musculoskeletal: Benign bone island in the right acetabulum, T8 and L2 vertebrae and both femurs. Sacralization of the L5 vertebra. Grade 1 retrolisthesis of L4 on L5 and L1 on L2, probably degenerative. IMPRESSION: No ureteral calculus or acute abdominopelvic disease. Punctate nonobstructing right renal calculus and  benign-appearing right renal cortical cyst. Mild diverticulosis coli without evidence of acute diverticulitis. Small hiatal hernia. Mild hepatomegaly. Small bilateral direct inguinal and tiny periumbilical herniations of fat without strangulation or incarceration. Grade 1 retrolisthesis of L4 on L5 and L1 on L2, probably degenerative. Electronically Signed   By: Revonda Humphrey   On: 09/18/2019 19:20    Procedures Procedures (including critical care time)  Medications Ordered in ED Medications  sodium chloride 0.9 % bolus 1,000 mL (0 mLs Intravenous Stopped 09/18/19 1953)  acetaminophen (TYLENOL) tablet 1,000 mg (1,000 mg Oral Given 09/18/19 1816)  ciprofloxacin (CIPRO) tablet 500 mg (500 mg Oral Given 09/18/19 1957)    ED Course  I have reviewed the triage vital signs and  the nursing notes.  Pertinent labs & imaging results that were available during my care of the patient were reviewed by me and considered in my medical decision making (see chart for details).    MDM Rules/Calculators/A&P                      No obvious source of infection.  Covid negative.  UA does show some blood, but no bacteria.  I will culture urine and treat with cipro.  Pt is instructed to f/u with urology as he may need a cystoscopy.  Pt knows to return if worse.  F/u also with pcp.  Gaylyn Lambert was evaluated in Emergency Department on 09/18/2019 for the symptoms described in the history of present illness. He was evaluated in the context of the global COVID-19 pandemic, which necessitated consideration that the patient might be at risk for infection with the SARS-CoV-2 virus that causes COVID-19. Institutional protocols and algorithms that pertain to the evaluation of patients at risk for COVID-19 are in a state of rapid change based on information released by regulatory bodies including the CDC and federal and state organizations. These policies and algorithms were followed during the patient's care in the  ED. Final Clinical Impression(s) / ED Diagnoses Final diagnoses:  Fever in adult  Hematuria, unspecified type    Rx / DC Orders ED Discharge Orders         Ordered    ciprofloxacin (CIPRO) 500 MG tablet  2 times daily     09/18/19 1952           Isla Pence, MD 09/18/19 2136

## 2019-09-20 LAB — URINE CULTURE: Culture: NO GROWTH

## 2019-09-26 ENCOUNTER — Ambulatory Visit: Payer: BC Managed Care – PPO | Attending: Internal Medicine

## 2019-09-26 DIAGNOSIS — Z23 Encounter for immunization: Secondary | ICD-10-CM

## 2019-09-26 NOTE — Progress Notes (Signed)
   Covid-19 Vaccination Clinic  Name:  Timothy Villarreal    MRN: [id number] DOB: 25-Jul-1970  09/26/2019  [name] was observed post Covid-19 immunization for 15 minutes without incident. He was provided with Vaccine Information Sheet and instruction to access the V-Safe system.   [name] was instructed to call 911 with any severe reactions post vaccine: Marland Kitchen Difficulty breathing  . Swelling of face and throat  . A fast heartbeat  . A bad rash all over body  . Dizziness and weakness   Immunizations Administered    Name Date Dose VIS Date Route   Moderna COVID-19 Vaccine 09/26/2019 10:33 AM 0.5 mL 06/04/2019 Intramuscular   Manufacturer: Moderna   Lot: [id number]   [id number]

## 2019-10-21 DIAGNOSIS — R3129 Other microscopic hematuria: Secondary | ICD-10-CM | POA: Diagnosis not present

## 2019-10-21 DIAGNOSIS — F900 Attention-deficit hyperactivity disorder, predominantly inattentive type: Secondary | ICD-10-CM | POA: Diagnosis not present

## 2019-10-29 ENCOUNTER — Ambulatory Visit: Payer: BC Managed Care – PPO | Attending: Internal Medicine

## 2019-10-29 DIAGNOSIS — Z23 Encounter for immunization: Secondary | ICD-10-CM

## 2019-10-29 NOTE — Progress Notes (Signed)
   Covid-19 Vaccination Clinic  Name:  Timothy Villarreal    MRN: [id number] DOB: 1970-08-15  10/29/2019  [name] was observed post Covid-19 immunization for 15 minutes without incident. He was provided with Vaccine Information Sheet and instruction to access the V-Safe system.   [name] was instructed to call 911 with any severe reactions post vaccine: Marland Kitchen Difficulty breathing  . Swelling of face and throat  . A fast heartbeat  . A bad rash all over body  . Dizziness and weakness   Immunizations Administered    Name Date Dose VIS Date Route   Moderna COVID-19 Vaccine 10/29/2019 12:53 PM 0.5 mL 06/2019 Intramuscular   Manufacturer: Moderna   Lot: [id number]   [id number]

## 2020-02-27 ENCOUNTER — Encounter: Payer: Self-pay | Admitting: Family Medicine

## 2020-02-27 ENCOUNTER — Ambulatory Visit: Payer: BC Managed Care – PPO | Admitting: Family Medicine

## 2020-02-27 ENCOUNTER — Ambulatory Visit: Payer: Self-pay

## 2020-02-27 ENCOUNTER — Other Ambulatory Visit: Payer: Self-pay

## 2020-02-27 VITALS — Ht 74.0 in | Wt 250.0 lb

## 2020-02-27 DIAGNOSIS — M545 Low back pain, unspecified: Secondary | ICD-10-CM

## 2020-02-27 MED ORDER — NABUMETONE 500 MG PO TABS: 500.0000 mg | ORAL_TABLET | Freq: Two times a day (BID) | ORAL | 3 refills | Status: DC | PRN

## 2020-02-27 MED ORDER — BACLOFEN 10 MG PO TABS: 5.0000 mg | ORAL_TABLET | Freq: Every evening | ORAL | 3 refills | Status: DC | PRN

## 2020-02-27 NOTE — Progress Notes (Signed)
Pain in lower back with more right side pain with no leg pain Pain since last spring    Left arm itches

## 2020-02-27 NOTE — Progress Notes (Signed)
   Office Visit Note   Patient: Timothy Villarreal           Date of Birth: 1970/10/18           MRN: [id number] Visit Date: 02/27/2020 Requested by: No referring provider defined for this encounter. PCP: Patient, No Pcp Per  Subjective: Chief Complaint  Patient presents with  . Lower Back - Pain    HPI: He is here with low back pain.  Symptoms started a while ago, no definite injury.  He started noticing pain in the midline lumbosacral area with occasional radiation into his posterior hips.  He gets worse with doing a lot of activities, but sometimes the pain is constant.  He does not take medications on a regular basis.  He went to another orthopedic group and was given some medications but has not tried them.  Denies any bowel or bladder dysfunction.  He does have some chronic numbness in his left anterior lateral thigh, not necessarily related to his back pain.  He and his wife are trying to become more physically active and eat more healthfully.  Some of his exercises seem to be aggravating his symptoms.              ROS:   All other systems were reviewed and are negative.  Objective: Vital Signs: Ht 6\' 2"  (1.88 m)   Wt 250 lb (113.4 kg)   BMI 32.10 kg/m   Physical Exam:  General:  Alert and oriented, in no acute distress. Pulm:  Breathing unlabored. Psy:  Normal mood, congruent affect. Skin: No rash Low back: He has a little bit of tenderness in the midline over the L5-S1 level.  No pain in the sciatic notch today, no pain over the SI joints.  Lower extremity strength and reflexes are normal.  No pain with passive internal hip rotation.  Imaging: XR Lumbar Spine 2-3 Views  Result Date: 02/27/2020 X-rays lumbar spine reveal moderate diffuse degenerative disc disease.  No sign of compression fracture or neoplasm.  Mild arthritis in both hips.   Assessment & Plan: 1.  Chronic low back pain with degenerative disc disease, cannot rule out disc protrusion -We will try baclofen  and Relafen as needed.  Physical therapy at [facility] location. -If symptoms persist, then possibly MRI scan followed by epidural injection.     Procedures: No procedures performed  No notes on file     PMFS History: Patient Active Problem List   Diagnosis Date Noted  . Pain in right knee 09/14/2018   Past Medical History:  Diagnosis Date  . Cancer [facility])    thyroid cancer    History reviewed. No pertinent family history.  Past Surgical History:  Procedure Laterality Date  . THYROIDECTOMY     Social History   Occupational History  . Not on file  Tobacco Use  . Smoking status: Never Smoker  . Smokeless tobacco: Never Used  Substance and Sexual Activity  . Alcohol use: Not Currently  . Drug use: Not Currently  . Sexual activity: Not on file

## 2020-02-27 NOTE — Patient Instructions (Signed)
     Glucosamine Sulfate:  1,000 mg twice daily

## 2020-03-13 DIAGNOSIS — G4733 Obstructive sleep apnea (adult) (pediatric): Secondary | ICD-10-CM | POA: Diagnosis not present

## 2020-04-06 DIAGNOSIS — G4733 Obstructive sleep apnea (adult) (pediatric): Secondary | ICD-10-CM | POA: Diagnosis not present

## 2020-05-22 DIAGNOSIS — R7309 Other abnormal glucose: Secondary | ICD-10-CM | POA: Diagnosis not present

## 2020-05-22 DIAGNOSIS — N529 Male erectile dysfunction, unspecified: Secondary | ICD-10-CM | POA: Diagnosis not present

## 2020-05-22 DIAGNOSIS — R3129 Other microscopic hematuria: Secondary | ICD-10-CM | POA: Diagnosis not present

## 2020-05-22 DIAGNOSIS — Z125 Encounter for screening for malignant neoplasm of prostate: Secondary | ICD-10-CM | POA: Diagnosis not present

## 2020-05-22 DIAGNOSIS — Z23 Encounter for immunization: Secondary | ICD-10-CM | POA: Diagnosis not present

## 2020-05-22 DIAGNOSIS — E039 Hypothyroidism, unspecified: Secondary | ICD-10-CM | POA: Diagnosis not present

## 2020-06-08 DIAGNOSIS — N529 Male erectile dysfunction, unspecified: Secondary | ICD-10-CM | POA: Diagnosis not present

## 2020-06-25 DIAGNOSIS — Z03818 Encounter for observation for suspected exposure to other biological agents ruled out: Secondary | ICD-10-CM | POA: Diagnosis not present

## 2020-06-25 DIAGNOSIS — Z20822 Contact with and (suspected) exposure to covid-19: Secondary | ICD-10-CM | POA: Diagnosis not present

## 2020-07-06 DIAGNOSIS — R5383 Other fatigue: Secondary | ICD-10-CM | POA: Diagnosis not present

## 2020-07-06 DIAGNOSIS — E612 Magnesium deficiency: Secondary | ICD-10-CM | POA: Diagnosis not present

## 2020-07-06 DIAGNOSIS — E559 Vitamin D deficiency, unspecified: Secondary | ICD-10-CM | POA: Diagnosis not present

## 2020-07-06 DIAGNOSIS — E291 Testicular hypofunction: Secondary | ICD-10-CM | POA: Diagnosis not present

## 2020-07-06 DIAGNOSIS — Z1329 Encounter for screening for other suspected endocrine disorder: Secondary | ICD-10-CM | POA: Diagnosis not present

## 2020-07-06 DIAGNOSIS — G4733 Obstructive sleep apnea (adult) (pediatric): Secondary | ICD-10-CM | POA: Diagnosis not present

## 2020-07-06 DIAGNOSIS — E538 Deficiency of other specified B group vitamins: Secondary | ICD-10-CM | POA: Diagnosis not present

## 2020-07-06 DIAGNOSIS — Z131 Encounter for screening for diabetes mellitus: Secondary | ICD-10-CM | POA: Diagnosis not present

## 2020-07-23 DIAGNOSIS — N5201 Erectile dysfunction due to arterial insufficiency: Secondary | ICD-10-CM | POA: Diagnosis not present

## 2020-07-23 DIAGNOSIS — E291 Testicular hypofunction: Secondary | ICD-10-CM | POA: Diagnosis not present

## 2020-08-21 DIAGNOSIS — L821 Other seborrheic keratosis: Secondary | ICD-10-CM | POA: Diagnosis not present

## 2020-08-21 DIAGNOSIS — D225 Melanocytic nevi of trunk: Secondary | ICD-10-CM | POA: Diagnosis not present

## 2020-08-21 DIAGNOSIS — D1801 Hemangioma of skin and subcutaneous tissue: Secondary | ICD-10-CM | POA: Diagnosis not present

## 2020-08-21 DIAGNOSIS — L812 Freckles: Secondary | ICD-10-CM | POA: Diagnosis not present

## 2020-09-14 DIAGNOSIS — R5383 Other fatigue: Secondary | ICD-10-CM | POA: Diagnosis not present

## 2020-09-14 DIAGNOSIS — E291 Testicular hypofunction: Secondary | ICD-10-CM | POA: Diagnosis not present

## 2020-09-14 DIAGNOSIS — E039 Hypothyroidism, unspecified: Secondary | ICD-10-CM | POA: Diagnosis not present

## 2020-09-14 DIAGNOSIS — Z131 Encounter for screening for diabetes mellitus: Secondary | ICD-10-CM | POA: Diagnosis not present

## 2020-09-22 DIAGNOSIS — D1722 Benign lipomatous neoplasm of skin and subcutaneous tissue of left arm: Secondary | ICD-10-CM | POA: Diagnosis not present

## 2020-09-22 DIAGNOSIS — D1724 Benign lipomatous neoplasm of skin and subcutaneous tissue of left leg: Secondary | ICD-10-CM | POA: Diagnosis not present

## 2020-09-22 DIAGNOSIS — D171 Benign lipomatous neoplasm of skin and subcutaneous tissue of trunk: Secondary | ICD-10-CM | POA: Diagnosis not present

## 2020-09-22 DIAGNOSIS — D1721 Benign lipomatous neoplasm of skin and subcutaneous tissue of right arm: Secondary | ICD-10-CM | POA: Diagnosis not present

## 2020-10-05 DIAGNOSIS — G4733 Obstructive sleep apnea (adult) (pediatric): Secondary | ICD-10-CM | POA: Diagnosis not present

## 2020-12-28 DIAGNOSIS — D1724 Benign lipomatous neoplasm of skin and subcutaneous tissue of left leg: Secondary | ICD-10-CM | POA: Diagnosis not present

## 2020-12-28 DIAGNOSIS — D1721 Benign lipomatous neoplasm of skin and subcutaneous tissue of right arm: Secondary | ICD-10-CM | POA: Diagnosis not present

## 2020-12-28 DIAGNOSIS — D171 Benign lipomatous neoplasm of skin and subcutaneous tissue of trunk: Secondary | ICD-10-CM | POA: Diagnosis not present

## 2020-12-28 DIAGNOSIS — D1722 Benign lipomatous neoplasm of skin and subcutaneous tissue of left arm: Secondary | ICD-10-CM | POA: Diagnosis not present

## 2021-01-05 DIAGNOSIS — G4733 Obstructive sleep apnea (adult) (pediatric): Secondary | ICD-10-CM | POA: Diagnosis not present

## 2021-03-09 DIAGNOSIS — S81812A Laceration without foreign body, left lower leg, initial encounter: Secondary | ICD-10-CM | POA: Diagnosis not present

## 2021-03-19 IMAGING — CT CT RENAL STONE PROTOCOL
2 of 4 series · 16 of 46 positions shown, 18 images · non-contrast
Comparison: None.

CLINICAL DATA: Flank pain. Suspected kidney stone.

EXAM:
CT ABDOMEN AND PELVIS WITHOUT CONTRAST
TECHNIQUE: Multidetector CT imaging of the abdomen and pelvis was performed
following the standard protocol without IV contrast. Automatic
exposure control utilized.

[Series 3: stone study 5.0 i30f 2 · axial · 0.94mm/px · z∈[-600,-120]mm · 13 of 106 slices shown, 15 images]
[im 5/106  soft-tissue]
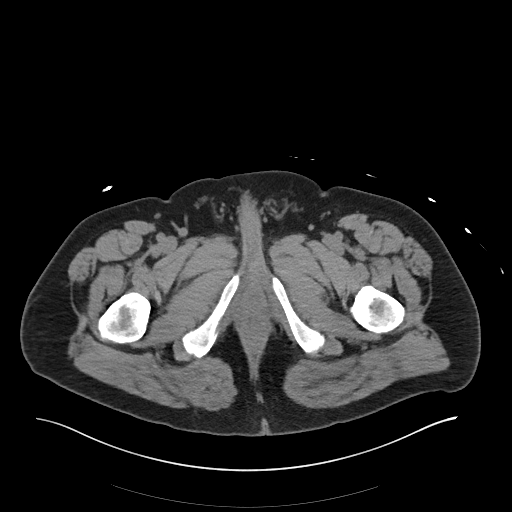
[im 5/106  bone]
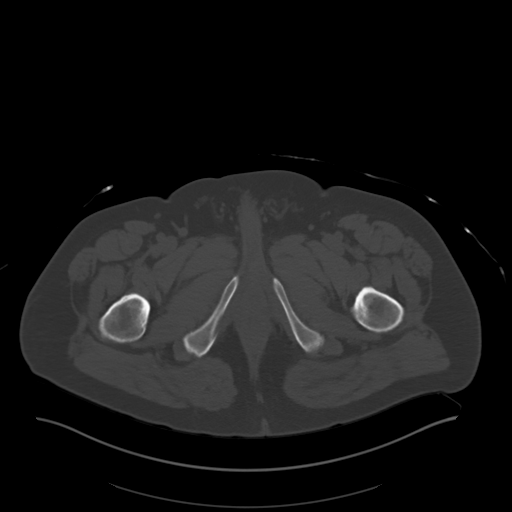
[im 14/106  soft-tissue]
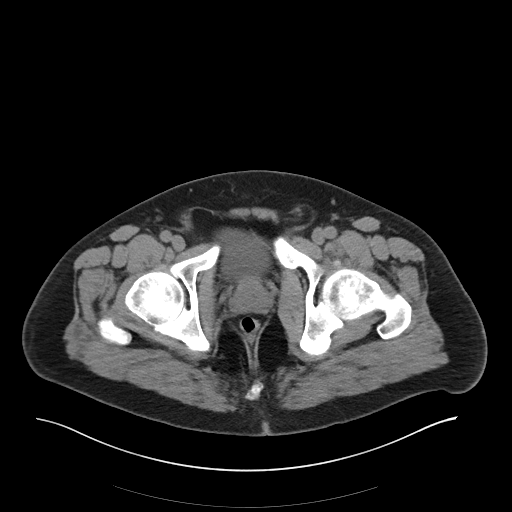
[im 22/106  soft-tissue]
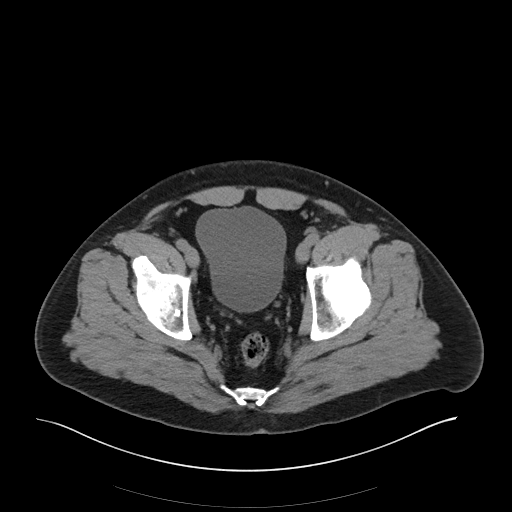
[im 31/106  soft-tissue]
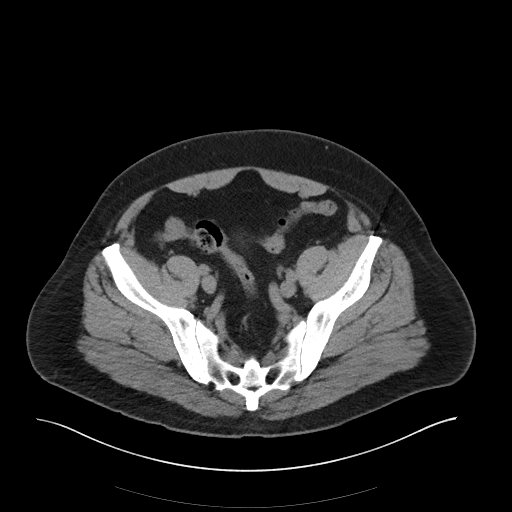
[im 36/106  soft-tissue]
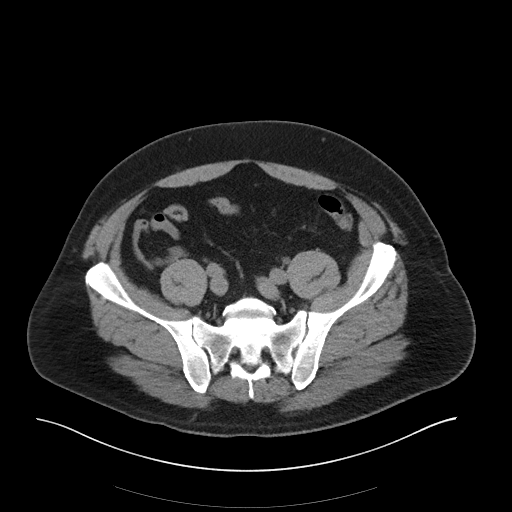
[im 44/106  soft-tissue]
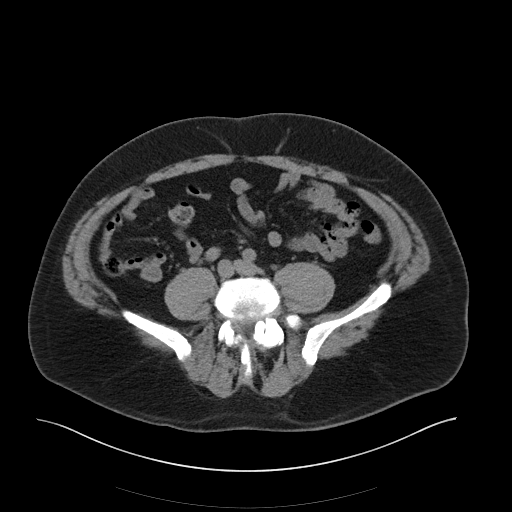
[im 53/106  soft-tissue]
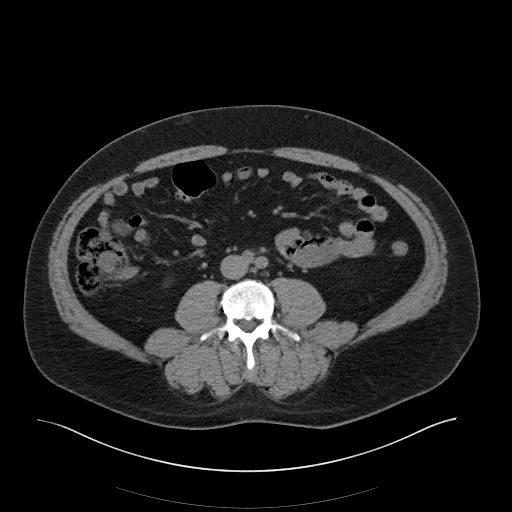
[im 62/106  soft-tissue]
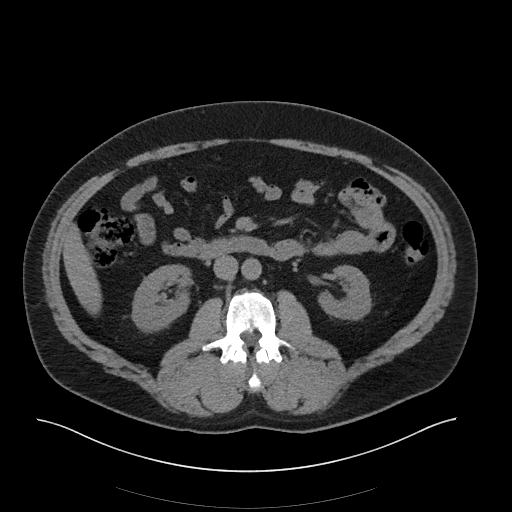
[im 71/106  soft-tissue]
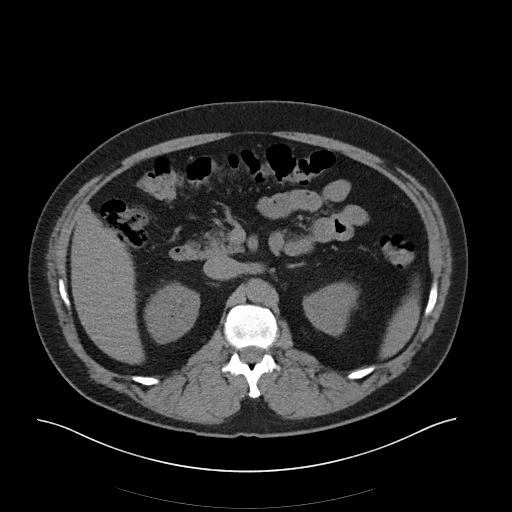
[im 71/106  bone]
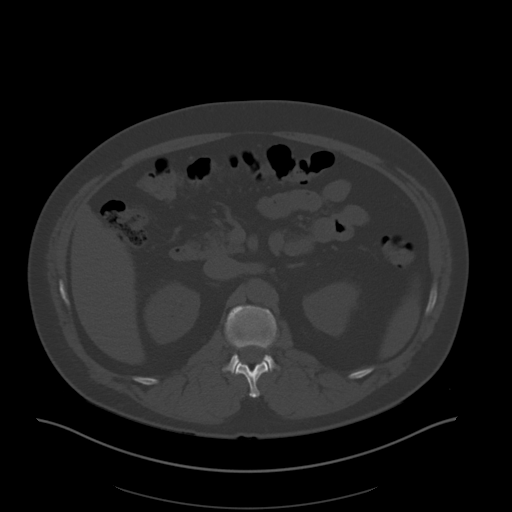
[im 75/106  soft-tissue]
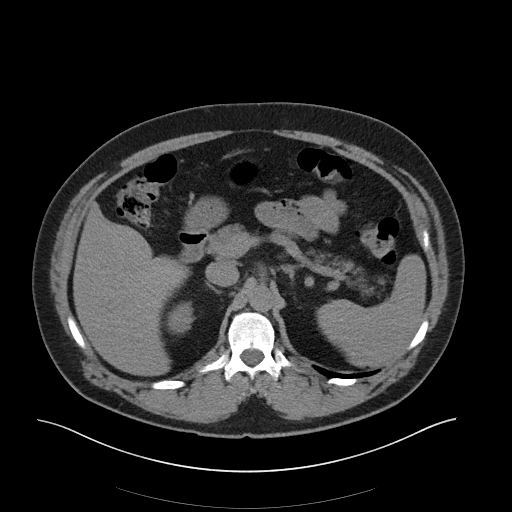
[im 84/106  soft-tissue]
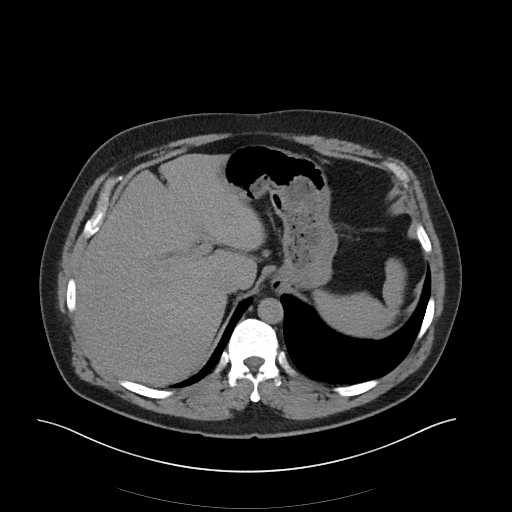
[im 92/106  soft-tissue]
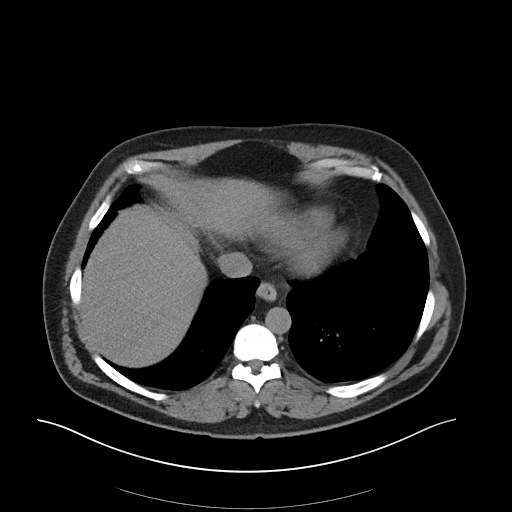
[im 101/106  soft-tissue]
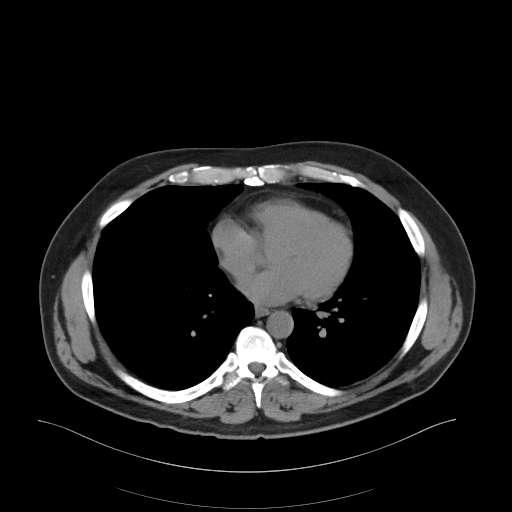

[Series 6: coronal soft tissue · coronal · 0.98mm/px · 3 of 110 slices shown]
[im 37/110  soft-tissue]
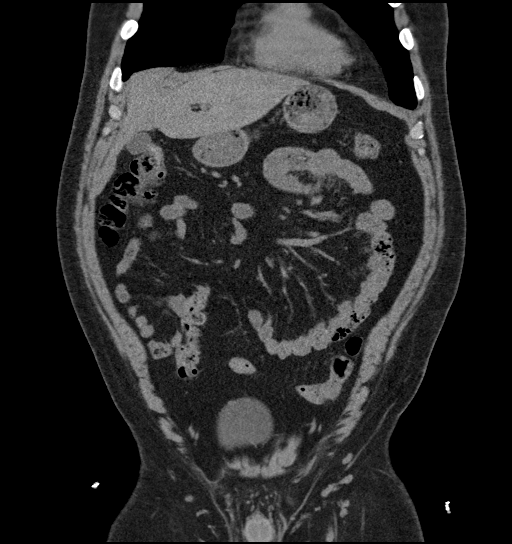
[im 49/110  soft-tissue]
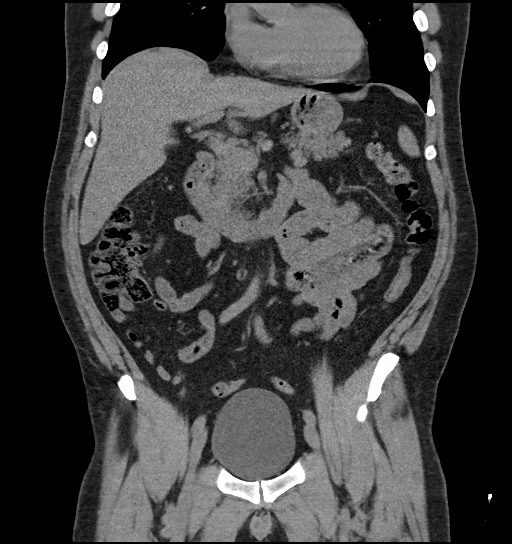
[im 61/110  soft-tissue]
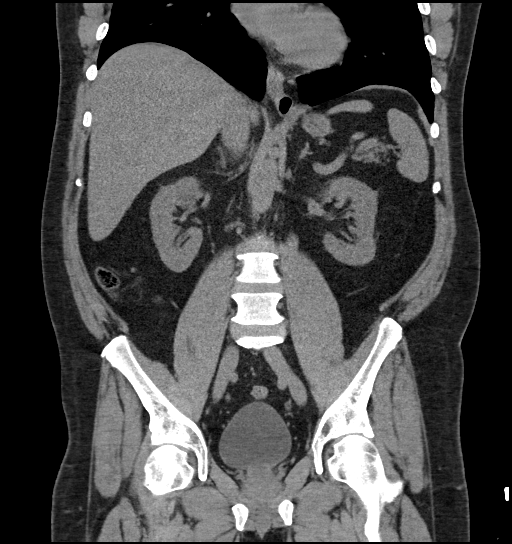

[16 of 46 positions shown; findings below may reference images not displayed]

FINDINGS: Lower chest: Minimal subpleural atelectasis. No dependent pleural
effusion. Normal heart size. Paucity of coronary calcification.
Small hiatal hernia.

Hepatobiliary: Mild hepatomegaly. Normal gallbladder and biliary
tree.

Pancreas: Mild atrophy. No acute pancreatitis.

Spleen: Normal.

Adrenals/Urinary Tract: Normal right and left adrenal glands. A
simple appearing 2.6 cm 4 Hounsfield unit right renal parapelvic
cyst. Punctate nonobstructing right renal calculus. Normal
appearance of the right ureter. Normal appearances of the left
kidney and ureter.

Stomach/Bowel: Normal appendix (series 3, image 65). Moderate stool
burden and mild diverticulosis coli without bowel obstruction. No
gastric, intra colonic wall thickening.

Vascular/Lymphatic: No abdominal aorta calcified atherosclerosis or
aneurysm.

Reproductive: Normal.

Other: Small bilateral direct inguinal and tiny periumbilical
herniations of fat without strangulation or incarceration.

Musculoskeletal: Benign bone island in the right acetabulum, T8 and
L2 vertebrae and both femurs. Sacralization of the L5 vertebra.
Grade 1 retrolisthesis of L4 on L5 and L1 on L2, probably
degenerative.
IMPRESSION: No ureteral calculus or acute abdominopelvic disease. Punctate
nonobstructing right renal calculus and benign-appearing right renal
cortical cyst.

Mild diverticulosis coli without evidence of acute diverticulitis.

Small hiatal hernia.

Mild hepatomegaly.

Small bilateral direct inguinal and tiny periumbilical herniations
of fat without strangulation or incarceration.

Grade 1 retrolisthesis of L4 on L5 and L1 on L2, probably
degenerative.

## 2021-04-20 DIAGNOSIS — G4733 Obstructive sleep apnea (adult) (pediatric): Secondary | ICD-10-CM | POA: Diagnosis not present

## 2021-05-21 DIAGNOSIS — G4733 Obstructive sleep apnea (adult) (pediatric): Secondary | ICD-10-CM | POA: Diagnosis not present

## 2021-06-20 DIAGNOSIS — G4733 Obstructive sleep apnea (adult) (pediatric): Secondary | ICD-10-CM | POA: Diagnosis not present

## 2021-06-26 DIAGNOSIS — U071 COVID-19: Secondary | ICD-10-CM | POA: Diagnosis not present

## 2021-07-19 DIAGNOSIS — G4733 Obstructive sleep apnea (adult) (pediatric): Secondary | ICD-10-CM | POA: Diagnosis not present

## 2021-07-21 DIAGNOSIS — G4733 Obstructive sleep apnea (adult) (pediatric): Secondary | ICD-10-CM | POA: Diagnosis not present

## 2021-08-19 DIAGNOSIS — G4733 Obstructive sleep apnea (adult) (pediatric): Secondary | ICD-10-CM | POA: Diagnosis not present

## 2021-08-23 DIAGNOSIS — L814 Other melanin hyperpigmentation: Secondary | ICD-10-CM | POA: Diagnosis not present

## 2021-08-23 DIAGNOSIS — L538 Other specified erythematous conditions: Secondary | ICD-10-CM | POA: Diagnosis not present

## 2021-08-23 DIAGNOSIS — L820 Inflamed seborrheic keratosis: Secondary | ICD-10-CM | POA: Diagnosis not present

## 2021-08-23 DIAGNOSIS — L568 Other specified acute skin changes due to ultraviolet radiation: Secondary | ICD-10-CM | POA: Diagnosis not present

## 2021-08-23 DIAGNOSIS — Z789 Other specified health status: Secondary | ICD-10-CM | POA: Diagnosis not present

## 2021-08-23 DIAGNOSIS — L821 Other seborrheic keratosis: Secondary | ICD-10-CM | POA: Diagnosis not present

## 2021-08-23 DIAGNOSIS — D1801 Hemangioma of skin and subcutaneous tissue: Secondary | ICD-10-CM | POA: Diagnosis not present

## 2021-09-16 DIAGNOSIS — G4733 Obstructive sleep apnea (adult) (pediatric): Secondary | ICD-10-CM | POA: Diagnosis not present

## 2021-10-17 DIAGNOSIS — G4733 Obstructive sleep apnea (adult) (pediatric): Secondary | ICD-10-CM | POA: Diagnosis not present

## 2021-10-22 DIAGNOSIS — G4733 Obstructive sleep apnea (adult) (pediatric): Secondary | ICD-10-CM | POA: Diagnosis not present

## 2021-11-16 DIAGNOSIS — G4733 Obstructive sleep apnea (adult) (pediatric): Secondary | ICD-10-CM | POA: Diagnosis not present

## 2021-12-17 DIAGNOSIS — G4733 Obstructive sleep apnea (adult) (pediatric): Secondary | ICD-10-CM | POA: Diagnosis not present

## 2022-01-21 DIAGNOSIS — G4733 Obstructive sleep apnea (adult) (pediatric): Secondary | ICD-10-CM | POA: Diagnosis not present

## 2022-04-27 ENCOUNTER — Ambulatory Visit: Payer: BC Managed Care – PPO

## 2022-04-27 ENCOUNTER — Ambulatory Visit: Payer: BC Managed Care – PPO | Admitting: Podiatry

## 2022-04-27 ENCOUNTER — Ambulatory Visit (INDEPENDENT_AMBULATORY_CARE_PROVIDER_SITE_OTHER): Payer: BC Managed Care – PPO

## 2022-04-27 DIAGNOSIS — M10071 Idiopathic gout, right ankle and foot: Secondary | ICD-10-CM

## 2022-04-27 DIAGNOSIS — M722 Plantar fascial fibromatosis: Secondary | ICD-10-CM

## 2022-04-27 DIAGNOSIS — M778 Other enthesopathies, not elsewhere classified: Secondary | ICD-10-CM

## 2022-04-27 MED ORDER — MELOXICAM 15 MG PO TABS: 15.0000 mg | ORAL_TABLET | Freq: Every day | ORAL | 1 refills | Status: DC

## 2022-04-27 MED ORDER — METHYLPREDNISOLONE 4 MG PO TBPK: ORAL_TABLET | ORAL | 0 refills | Status: DC

## 2022-04-27 NOTE — Progress Notes (Signed)
Gout right forefoot      Chief Complaint  Patient presents with   Foot Pain    Patient is here for right foot pain.    HPI: 51 y.o. male presenting today as a new patient for evaluation of right forefoot pain that has been going on for about 1 week now.  Idiopathic onset.  He says it began in the middle of the night.  He is concerned for possible gout.  Past Medical History:  Diagnosis Date   Cancer [facility])    thyroid cancer    Past Surgical History:  Procedure Laterality Date   THYROIDECTOMY      No Known Allergies   Physical Exam: General: The patient is alert and oriented x3 in no acute distress.  Dermatology: Skin is warm, dry and supple bilateral lower extremities. Negative for open lesions or macerations.  Vascular: Palpable pedal pulses bilaterally. Capillary refill within normal limits.  Erythema with edema noted to the right forefoot with this is associated tenderness to palpation  Neurological: Light touch and protective threshold grossly intact  Musculoskeletal Exam: No pedal deformities noted.  Tenderness to palpation throughout the right forefoot  Radiographic Exam B/L foot 04/27/2022:  Normal osseous mineralization. Joint spaces preserved. No fracture/dislocation/boney destruction.    Assessment: 1.  Acute gout right forefoot   Plan of Care:  1. Patient evaluated. X-Rays reviewed.  2.  Injection of 0.5 cc Celestone Soluspan injected into the right forefoot region 3.  Prescription for Medrol Dosepak 4.  Prescription for meloxicam 15 mg daily 5.  Postsurgical shoe dispensed.  WBAT 6.  Return to clinic 3 weeks   Edrick Kins, DPM [facility]  Dr. Edrick Kins, DPM    [address], [place] [address]                Office [phone]  Fax [phone]

## 2022-05-04 DIAGNOSIS — M778 Other enthesopathies, not elsewhere classified: Secondary | ICD-10-CM | POA: Diagnosis not present

## 2022-05-04 MED ORDER — BETAMETHASONE SOD PHOS & ACET 6 (3-3) MG/ML IJ SUSP
3.0000 mg | Freq: Once | INTRAMUSCULAR | Status: AC
  Administered 2022-05-04: 3 mg via INTRA_ARTICULAR

## 2022-05-18 ENCOUNTER — Ambulatory Visit: Payer: BC Managed Care – PPO | Admitting: Podiatry

## 2022-05-31 ENCOUNTER — Other Ambulatory Visit: Payer: Self-pay

## 2022-05-31 ENCOUNTER — Encounter (HOSPITAL_BASED_OUTPATIENT_CLINIC_OR_DEPARTMENT_OTHER): Payer: Self-pay | Admitting: Emergency Medicine

## 2022-05-31 ENCOUNTER — Emergency Department (HOSPITAL_BASED_OUTPATIENT_CLINIC_OR_DEPARTMENT_OTHER)
Admission: EM | Admit: 2022-05-31 | Discharge: 2022-05-31 | Disposition: A | Discharge status: Home / Self Care | Payer: BC Managed Care – PPO | Attending: Emergency Medicine | Admitting: Emergency Medicine

## 2022-05-31 DIAGNOSIS — R339 Retention of urine, unspecified: Secondary | ICD-10-CM | POA: Insufficient documentation

## 2022-05-31 LAB — URINALYSIS, ROUTINE W REFLEX MICROSCOPIC
Bilirubin Urine: NEGATIVE
Glucose, UA: NEGATIVE mg/dL
Ketones, ur: NEGATIVE mg/dL
Nitrite: NEGATIVE
Protein, ur: 30 mg/dL — AB
Specific Gravity, Urine: 1.02 (ref 1.005–1.030)
pH: 5.5 (ref 5.0–8.0)

## 2022-05-31 LAB — COMPREHENSIVE METABOLIC PANEL
ALT: 14 U/L (ref 0–44)
AST: 11 U/L — ABNORMAL LOW (ref 15–41)
Albumin: 4.6 g/dL (ref 3.5–5.0)
Alkaline Phosphatase: 71 U/L (ref 38–126)
Anion gap: 12 (ref 5–15)
BUN: 20 mg/dL (ref 6–20)
CO2: 26 mmol/L (ref 22–32)
Calcium: 9.3 mg/dL (ref 8.9–10.3)
Chloride: 96 mmol/L — ABNORMAL LOW (ref 98–111)
Creatinine, Ser: 1.24 mg/dL (ref 0.61–1.24)
GFR, Estimated: >60 mL/min (ref >60)
Glucose, Bld: 114 mg/dL — ABNORMAL HIGH (ref 70–99)
Potassium: 3.6 mmol/L (ref 3.5–5.1)
Sodium: 134 mmol/L — ABNORMAL LOW (ref 135–145)
Total Bilirubin: 1.6 mg/dL — ABNORMAL HIGH (ref 0.3–1.2)
Total Protein: 7.8 g/dL (ref 6.5–8.1)

## 2022-05-31 LAB — CBC
HCT: 44.8 % (ref 39.0–52.0)
Hemoglobin: 15.4 g/dL (ref 13.0–17.0)
MCH: 29.7 pg (ref 26.0–34.0)
MCHC: 34.4 g/dL (ref 30.0–36.0)
MCV: 86.3 fL (ref 80.0–100.0)
Platelets: 206 10*3/uL (ref 150–400)
RBC: 5.19 MIL/uL (ref 4.22–5.81)
RDW: 13.3 % (ref 11.5–15.5)
WBC: 16.4 10*3/uL — ABNORMAL HIGH (ref 4.0–10.5)
nRBC: 0 % (ref 0.0–0.2)

## 2022-05-31 MED ORDER — CEFDINIR 300 MG PO CAPS
300.0000 mg | ORAL_CAPSULE | Freq: Two times a day (BID) | ORAL | Status: DC
  Administered 2022-05-31: 300 mg via ORAL
  Filled 2022-05-31: qty 1

## 2022-05-31 MED ORDER — TAMSULOSIN HCL 0.4 MG PO CAPS: 0.4000 mg | ORAL_CAPSULE | Freq: Every day | ORAL | 0 refills | Status: AC

## 2022-05-31 MED ORDER — CEFDINIR 300 MG PO CAPS: 300.0000 mg | ORAL_CAPSULE | Freq: Two times a day (BID) | ORAL | 0 refills | Status: AC

## 2022-05-31 MED ORDER — TAMSULOSIN HCL 0.4 MG PO CAPS
0.4000 mg | ORAL_CAPSULE | Freq: Every day | ORAL | Status: DC
  Administered 2022-05-31: 0.4 mg via ORAL
  Filled 2022-05-31: qty 1

## 2022-05-31 NOTE — ED Triage Notes (Signed)
Pt arrives to ED with c/o urinary retention. This started x2 days ago. When he urinates he gets suprapubic pressure. He also notes to have constipation and lower back pain. Was seen at [facility] x2 days ago and was given Cipro.

## 2022-05-31 NOTE — ED Provider Notes (Signed)
[place] EMERGENCY DEPT Provider Note   CSN: [id number] Arrival date & time: 05/31/22  1024     History Chief Complaint  Patient presents with   Urinary Retention    HPI Timothy Villarreal is a 51 y.o. male presenting for difficulty with urination.  He is an otherwise healthy 51 year old male, minimal medical history.  He states that he has been having dribbling with urination over the past 2 weeks taking multiple attempts to get started urinating.  He denies fevers or chills, nausea vomiting, syncope shortness of breath.  He is otherwise ambulatory tolerating p.o. intake.  No known sick contacts.   Patient's recorded medical, surgical, social, medication list and allergies were reviewed in the Snapshot window as part of the initial history.   Review of Systems   Review of Systems  Constitutional:  Negative for chills and fever.  HENT:  Negative for ear pain and sore throat.   Eyes:  Negative for pain and visual disturbance.  Respiratory:  Negative for cough and shortness of breath.   Cardiovascular:  Negative for chest pain and palpitations.  Gastrointestinal:  Negative for abdominal pain and vomiting.  Genitourinary:  Positive for dysuria and frequency. Negative for hematuria.  Musculoskeletal:  Negative for arthralgias and back pain.  Skin:  Negative for color change and rash.  Neurological:  Negative for seizures and syncope.  All other systems reviewed and are negative.   Physical Exam Updated Vital Signs BP 135/87 (BP Location: Right Arm)   Pulse 79   Temp 97.7 F (36.5 C) (Oral)   Resp 18   Ht '6\' 2"'$  (1.88 m)   Wt 113.4 kg   SpO2 98%   BMI 32.10 kg/m  Physical Exam Vitals and nursing note reviewed.  Constitutional:      General: He is not in acute distress.    Appearance: He is well-developed.  HENT:     Head: Normocephalic and atraumatic.  Eyes:     Conjunctiva/sclera: Conjunctivae normal.  Cardiovascular:     Rate and Rhythm: Normal  rate and regular rhythm.     Heart sounds: No murmur heard. Pulmonary:     Effort: Pulmonary effort is normal. No respiratory distress.     Breath sounds: Normal breath sounds.  Abdominal:     Palpations: Abdomen is soft.     Tenderness: There is no abdominal tenderness.  Musculoskeletal:        General: No swelling.     Cervical back: Neck supple.  Skin:    General: Skin is warm and dry.     Capillary Refill: Capillary refill takes less than 2 seconds.  Neurological:     Mental Status: He is alert.  Psychiatric:        Mood and Affect: Mood normal.      ED Course/ Medical Decision Making/ A&P    Procedures Ultrasound ED Renal  Date/Time: 05/31/2022 11:41 AM  Performed by: Tretha Sciara, MD Authorized by: Tretha Sciara, MD   Procedure details:    Indications: urinary retention     Technique:  BladderImages: archived Bladder findings:    Bladder:  Visualized   Post-void residual volume:  37m    Medications Ordered in ED Medications  tamsulosin (FLOMAX) capsule 0.4 mg (0.4 mg Oral Given 05/31/22 1159)  cefdinir (OMNICEF) capsule 300 mg (300 mg Oral Given 05/31/22 1159)    Medical Decision Making:    Timothy HUBERSis a 51y.o. male who presented to the ED today with dysuria  and urinary retention detailed above.     Patient placed on continuous vitals and telemetry monitoring while in ED which was reviewed periodically.   Complete initial physical exam performed, notably the patient  was HDS in NAD.      Reviewed and confirmed nursing documentation for past medical history, family history, social history.    Initial Assessment:   Patient's history of present illness and physical exam findings are most consistent with developing urinary retention.  His postvoid residual was 85 mL.  He is likely suffering from BPH based on the ultrasound finding with an enlarged prostate visible as well.  The presence of leukocytosis is well as greater than 50 mL in his  bladder after voiding raises concern for developing urinary tract infection.  Will treat patient with Omnicef, start patient on Foley catheter and have patient follow-up with urology in the outpatient setting ideally within 2 weeks.  When I discussed this plan with the patient, he stated he would prefer to trial medical therapy first.  Since he has less than 100 mL in his bladder, I do believe this would be reasonable to trial Flomax and broaden his antibiotic.  I was informed that if he starts having worsening symptoms he will need to return and have a Foley catheter put in.  In the interim, he will attempt to follow-up with urology for ongoing care and management outpatient setting.  Disposition:  I have considered need for hospitalization, however, considering all of the above, I believe this patient is stable for discharge at this time.  Patient/family educated about specific return precautions for given chief complaint and symptoms.  Patient/family educated about follow-up with PCP and uroloy.    Patient/family expressed understanding of return precautions and need for follow-up. Patient spoken to regarding all imaging and laboratory results and appropriate follow up for these results. All education provided in verbal form with additional information in written form. Time was allowed for answering of patient questions. Patient discharged.    Emergency Department Medication Summary:   Medications  tamsulosin (FLOMAX) capsule 0.4 mg (0.4 mg Oral Given 05/31/22 1159)  cefdinir (OMNICEF) capsule 300 mg (300 mg Oral Given 05/31/22 1159)        Clinical Impression:  1. Urinary retention      Discharge   Final Clinical Impression(s) / ED Diagnoses Final diagnoses:  Urinary retention    Rx / DC Orders ED Discharge Orders          Ordered    cefdinir (OMNICEF) 300 MG capsule  2 times daily        05/31/22 1220    tamsulosin (FLOMAX) 0.4 MG CAPS capsule  Daily        05/31/22 1220               Tretha Sciara, MD 05/31/22 1220

## 2022-06-08 ENCOUNTER — Emergency Department (HOSPITAL_BASED_OUTPATIENT_CLINIC_OR_DEPARTMENT_OTHER)
Admission: EM | Admit: 2022-06-08 | Discharge: 2022-06-08 | Disposition: A | Discharge status: Home / Self Care | Payer: BC Managed Care – PPO | Attending: Emergency Medicine | Admitting: Emergency Medicine

## 2022-06-08 ENCOUNTER — Other Ambulatory Visit: Payer: Self-pay

## 2022-06-08 ENCOUNTER — Encounter (HOSPITAL_BASED_OUTPATIENT_CLINIC_OR_DEPARTMENT_OTHER): Payer: Self-pay

## 2022-06-08 DIAGNOSIS — R339 Retention of urine, unspecified: Secondary | ICD-10-CM | POA: Diagnosis not present

## 2022-06-08 DIAGNOSIS — R338 Other retention of urine: Secondary | ICD-10-CM

## 2022-06-08 LAB — URINALYSIS, ROUTINE W REFLEX MICROSCOPIC
Bilirubin Urine: NEGATIVE
Glucose, UA: NEGATIVE mg/dL
Hgb urine dipstick: NEGATIVE
Ketones, ur: NEGATIVE mg/dL
Leukocytes,Ua: NEGATIVE
Nitrite: NEGATIVE
Protein, ur: NEGATIVE mg/dL
Specific Gravity, Urine: 1.021 (ref 1.005–1.030)
pH: 6.5 (ref 5.0–8.0)

## 2022-06-08 NOTE — Discharge Instructions (Signed)
You were seen today and found to have urinary retention.  Keep catheter in place for the next 5 to 7 days.  Follow-up closely with urology for catheter removal.

## 2022-06-08 NOTE — ED Notes (Signed)
Pt foley bag replaced with leg bag. DC instructions provided.

## 2022-06-08 NOTE — ED Triage Notes (Signed)
Pt states that he was seen last week for Prostatitis. Pt states that he hasn't been able to urinate in several hours and feels like his bladder is full. He states that he feels like he needs to urinate, but can't.

## 2022-06-08 NOTE — ED Provider Notes (Signed)
[place] EMERGENCY DEPT Provider Note   CSN: [id number] Arrival date & time: 06/08/22  0259     History  Chief Complaint  Patient presents with   Urinary Retention    HISAO DOO is a 51 y.o. male.  HPI     This is a 51 year old male who presents with concerns for urinary retention.  Patient reports that he has had ongoing issues with having urgency and difficulty urinating.  He was seen and evaluated just over a week ago and was treated for prostatitis and a UTI.  He has followed up with urology.  He is currently on Flomax twice daily and Bactrim.  He states tonight he tried to go to the bathroom around 2 AM.  He states he has significant urge but only dribbles.  He states he last urinated with stream around midnight but it was not "normal."  He has never had a catheter before.  Bedside bladder scan with greater than 400 cc.  Home Medications Prior to Admission medications   Medication Sig Start Date End Date Taking? Authorizing Provider  amphetamine-dextroamphetamine (ADDERALL) 10 MG tablet Take by mouth.    [provider]  baclofen (LIORESAL) 10 MG tablet Take 0.5-1 tablets (5-10 mg total) by mouth at bedtime as needed for muscle spasms. 02/27/20   Hilts, Legrand Como, MD  levothyroxine (SYNTHROID) 175 MCG tablet levothyroxine 175 mcg tablet 09/06/19   [provider]  meloxicam (MOBIC) 15 MG tablet Take 1 tablet (15 mg total) by mouth daily. 04/27/22   Edrick Kins, DPM  methylPREDNISolone (MEDROL DOSEPAK) 4 MG TBPK tablet 6 day dose pack - take as directed 04/27/22   Edrick Kins, DPM  nabumetone (RELAFEN) 500 MG tablet Take 1 tablet (500 mg total) by mouth 2 (two) times daily as needed. 02/27/20   Hilts, Legrand Como, MD  tamsulosin (FLOMAX) 0.4 MG CAPS capsule Take 1 capsule (0.4 mg total) by mouth daily. 05/31/22 06/30/22  Tretha Sciara, MD      Allergies    Patient has no known allergies.    Review of Systems   Review of Systems   Constitutional:  Negative for fever.  Genitourinary:  Positive for difficulty urinating. Negative for dysuria.  All other systems reviewed and are negative.   Physical Exam Updated Vital Signs BP (!) 138/91   Pulse 71   Temp 97.8 F (36.6 C) (Oral)   Resp 18   Ht 1.88 m ('6\' 2"'$ )   Wt 112.5 kg   SpO2 91%   BMI 31.84 kg/m  Physical Exam Vitals and nursing note reviewed.  Constitutional:      Appearance: He is well-developed. He is not ill-appearing.  HENT:     Head: Normocephalic and atraumatic.  Eyes:     Pupils: Pupils are equal, round, and reactive to light.  Cardiovascular:     Rate and Rhythm: Normal rate and regular rhythm.  Pulmonary:     Effort: Pulmonary effort is normal. No respiratory distress.  Abdominal:     Palpations: Abdomen is soft.     Tenderness: There is no abdominal tenderness. There is no right CVA tenderness or left CVA tenderness.  Musculoskeletal:     Cervical back: Neck supple.  Lymphadenopathy:     Cervical: No cervical adenopathy.  Skin:    General: Skin is warm and dry.  Neurological:     Mental Status: He is alert and oriented to person, place, and time.  Psychiatric:        Mood  and Affect: Mood normal.     ED Results / Procedures / Treatments   Labs (all labs ordered are listed, but only abnormal results are displayed) Labs Reviewed  URINALYSIS, ROUTINE W REFLEX MICROSCOPIC    EKG None  Radiology No results found.  Procedures Procedures    Medications Ordered in ED Medications - No data to display  ED Course/ Medical Decision Making/ A&P Clinical Course as of 06/08/22 0422  Wed Jun 08, 2022  0420 Patient was just over 400 cc in catheter bag.  States he feels much relief.  Will give a leg bag.  Recommend urology follow-up in 5 to 7 days.  Urinalysis appears clean today. [CH]    Clinical Course User Index [CH] Shalawn Wynder, Barbette Hair, MD                           Medical Decision Making Amount and/or Complexity of Data  Reviewed Labs: ordered.   This patient presents to the ED for concern of urinary retention, this involves an extensive number of treatment options, and is a complaint that carries with it a high risk of complications and morbidity.  I considered the following differential and admission for this acute, potentially life threatening condition.  The differential diagnosis includes acute urinary retention secondary to BPH or infection, bladder outlet obstruction, kidney injury  MDM:    This is a 51 year old male who presents with concerns for urinary urgency.  He has been unable to urinate since midnight and has great urge could not spontaneously urinate.  He is overall nontoxic.  Vital signs notable for blood pressure of 138/91.  Bedside ultrasound with greater than 400 cc in the bladder.  Bladder catheter was placed with good drainage.  Patient had immediate relief of symptoms.  Urinalysis today is negative without evidence of a UTI.  Discussed with patient catheter care and follow-up with urology in 5 to 7 days.  Patient stated understanding.  (Labs, imaging, consults)  Labs: I Ordered, and personally interpreted labs.  The pertinent results include: Urinalysis  Imaging Studies ordered: I ordered imaging studies including none I independently visualized and interpreted imaging. I agree with the radiologist interpretation  Additional history obtained from chart review.  External records from outside source obtained and reviewed including urology note from yesterday  Cardiac Monitoring: The patient was maintained on a cardiac monitor.  I personally viewed and interpreted the cardiac monitored which showed an underlying rhythm of: Sinus rhythm  Reevaluation: After the interventions noted above, I reevaluated the patient and found that they have :improved  Social Determinants of Health:  lives independently  Disposition: Discharge  Co morbidities that complicate the patient  evaluation  Past Medical History:  Diagnosis Date   Cancer ([place])    thyroid cancer     Medicines No orders of the defined types were placed in this encounter.   I have reviewed the patients home medicines and have made adjustments as needed  Problem List / ED Course: Problem List Items Addressed This Visit   None Visit Diagnoses     Acute urinary retention    -  Primary                   Final Clinical Impression(s) / ED Diagnoses Final diagnoses:  Acute urinary retention    Rx / DC Orders ED Discharge Orders     None         Aneliese Beaudry, Barbette Hair, MD  06/08/22 0424  

## 2022-06-14 ENCOUNTER — Emergency Department (HOSPITAL_BASED_OUTPATIENT_CLINIC_OR_DEPARTMENT_OTHER)
Admission: EM | Admit: 2022-06-14 | Discharge: 2022-06-14 | Disposition: A | Discharge status: Home / Self Care | Payer: BC Managed Care – PPO | Attending: Emergency Medicine | Admitting: Emergency Medicine

## 2022-06-14 ENCOUNTER — Other Ambulatory Visit: Payer: Self-pay

## 2022-06-14 ENCOUNTER — Encounter (HOSPITAL_BASED_OUTPATIENT_CLINIC_OR_DEPARTMENT_OTHER): Payer: Self-pay | Admitting: Emergency Medicine

## 2022-06-14 DIAGNOSIS — R339 Retention of urine, unspecified: Secondary | ICD-10-CM | POA: Diagnosis present

## 2022-06-14 DIAGNOSIS — N3000 Acute cystitis without hematuria: Secondary | ICD-10-CM | POA: Diagnosis not present

## 2022-06-14 LAB — URINALYSIS, ROUTINE W REFLEX MICROSCOPIC
Bilirubin Urine: NEGATIVE
Glucose, UA: NEGATIVE mg/dL
Hgb urine dipstick: NEGATIVE
Ketones, ur: NEGATIVE mg/dL
Nitrite: NEGATIVE
Protein, ur: NEGATIVE mg/dL
Specific Gravity, Urine: 1.011 (ref 1.005–1.030)
pH: 6.5 (ref 5.0–8.0)

## 2022-06-14 MED ORDER — CEPHALEXIN 250 MG PO CAPS
500.0000 mg | ORAL_CAPSULE | Freq: Once | ORAL | Status: AC
  Administered 2022-06-14: 500 mg via ORAL
  Filled 2022-06-14: qty 2

## 2022-06-14 MED ORDER — CEPHALEXIN 500 MG PO CAPS: 500.0000 mg | ORAL_CAPSULE | Freq: Four times a day (QID) | ORAL | 0 refills | Status: DC

## 2022-06-14 MED ORDER — IBUPROFEN 800 MG PO TABS
800.0000 mg | ORAL_TABLET | Freq: Once | ORAL | Status: AC
  Administered 2022-06-14: 800 mg via ORAL
  Filled 2022-06-14: qty 1

## 2022-06-14 NOTE — ED Notes (Addendum)
Provider to bedside to discuss  d/c plan - while provider at bedside provider able to assess penile head erythema; per provider not yeast - "irritation" only. Pt agreeable with d/c plan as discussed by provider.  This nurse has verbally reinforced d/c instructions and provided pt with written copy.  Pt acknowledges verbal understanding and denies any addl questions, concerns, needs- pt ambulatory independently at d/c with steady gait; vitals stable; no distress

## 2022-06-14 NOTE — ED Triage Notes (Signed)
Pt states he had a catheter placed x 6 days ago, reports catheter not draining well (minimal drainage) with bladder pain since 06/13/2022 early afternoon, pt reports he is scheduled to see urology on 06/23/22

## 2022-06-14 NOTE — ED Provider Notes (Signed)
[place] EMERGENCY DEPT Provider Note   CSN: [id number] Arrival date & time: 06/14/22  3474     History  Chief Complaint  Patient presents with   Urinary Retention    Timothy Villarreal is a 51 y.o. male.  The history is provided by the patient.  Illness Location:  Bladder Quality:  Does not feel like foley is draining as much as it needs to.  Bag is currently full with yellow urine.  Emptied 4 hours ago. Severity:  Moderate Onset quality:  Sudden Duration:  6 hours Timing:  Constant Progression:  Unchanged Chronicity:  New Context:  Has an indwelling foley being removed on 12/20 Relieved by:  Nothing Worsened by:  Nothing Ineffective treatments:  None Associated symptoms: no chest pain, no fever, no vomiting and no wheezing   Risk factors:  Indwelling foley catheter      Home Medications Prior to Admission medications   Medication Sig Start Date End Date Taking? Authorizing Provider  amphetamine-dextroamphetamine (ADDERALL) 10 MG tablet Take by mouth.    [provider]  baclofen (LIORESAL) 10 MG tablet Take 0.5-1 tablets (5-10 mg total) by mouth at bedtime as needed for muscle spasms. 02/27/20   Hilts, Legrand Como, MD  levothyroxine (SYNTHROID) 175 MCG tablet levothyroxine 175 mcg tablet 09/06/19   [provider]  meloxicam (MOBIC) 15 MG tablet Take 1 tablet (15 mg total) by mouth daily. 04/27/22   Edrick Kins, DPM  methylPREDNISolone (MEDROL DOSEPAK) 4 MG TBPK tablet 6 day dose pack - take as directed 04/27/22   Edrick Kins, DPM  nabumetone (RELAFEN) 500 MG tablet Take 1 tablet (500 mg total) by mouth 2 (two) times daily as needed. 02/27/20   Hilts, Legrand Como, MD  tamsulosin (FLOMAX) 0.4 MG CAPS capsule Take 1 capsule (0.4 mg total) by mouth daily. 05/31/22 06/30/22  Tretha Sciara, MD      Allergies    Patient has no known allergies.    Review of Systems   Review of Systems  Constitutional:  Negative for fever.  HENT:   Negative for facial swelling.   Eyes:  Negative for redness.  Respiratory:  Negative for wheezing.   Cardiovascular:  Negative for chest pain.  Gastrointestinal:  Negative for vomiting.  Genitourinary:  Negative for flank pain.       Sensation of incomplete evacuation   All other systems reviewed and are negative.   Physical Exam Updated Vital Signs BP (!) 143/87 (BP Location: Right Arm)   Pulse 76   Temp 97.6 F (36.4 C) (Oral)   Resp 17   Ht '6\' 2"'$  (1.88 m)   Wt 112.5 kg   SpO2 100%   BMI 31.84 kg/m  Physical Exam Vitals and nursing note reviewed. Exam conducted with a chaperone present.  Constitutional:      General: He is not in acute distress.    Appearance: He is well-developed. He is not diaphoretic.  HENT:     Head: Normocephalic and atraumatic.     Nose: Nose normal.  Eyes:     Conjunctiva/sclera: Conjunctivae normal.     Pupils: Pupils are equal, round, and reactive to light.  Cardiovascular:     Rate and Rhythm: Normal rate and regular rhythm.  Pulmonary:     Effort: Pulmonary effort is normal.     Breath sounds: Normal breath sounds. No wheezing or rales.  Abdominal:     General: Abdomen is flat. Bowel sounds are normal.     Palpations: Abdomen is  soft.     Tenderness: There is no abdominal tenderness. There is no guarding or rebound.  Musculoskeletal:        General: Normal range of motion.     Cervical back: Normal range of motion and neck supple.  Skin:    General: Skin is warm and dry.     Capillary Refill: Capillary refill takes less than 2 seconds.  Neurological:     General: No focal deficit present.     Mental Status: He is alert and oriented to person, place, and time.     Deep Tendon Reflexes: Reflexes normal.  Psychiatric:        Mood and Affect: Mood normal.        Behavior: Behavior normal.     ED Results / Procedures / Treatments   Labs (all labs ordered are listed, but only abnormal results are displayed) Results for orders placed  or performed during the hospital encounter of 06/14/22  Urinalysis, Routine w reflex microscopic Urine, Unspecified Source  Result Value Ref Range   Color, Urine COLORLESS (A) YELLOW   APPearance CLEAR CLEAR   Specific Gravity, Urine 1.011 1.005 - 1.030   pH 6.5 5.0 - 8.0   Glucose, UA NEGATIVE NEGATIVE mg/dL   Hgb urine dipstick NEGATIVE NEGATIVE   Bilirubin Urine NEGATIVE NEGATIVE   Ketones, ur NEGATIVE NEGATIVE mg/dL   Protein, ur NEGATIVE NEGATIVE mg/dL   Nitrite NEGATIVE NEGATIVE   Leukocytes,Ua TRACE (A) NEGATIVE   RBC / HPF 0-5 0 - 5 RBC/hpf   WBC, UA 11-20 0 - 5 WBC/hpf   No results found.   Radiology No results found.  Procedures Procedures    Medications Ordered in ED Medications  ibuprofen (ADVIL) tablet 800 mg (has no administration in time range)  cephALEXin (KEFLEX) capsule 500 mg (has no administration in time range)    ED Course/ Medical Decision Making/ A&P                           Medical Decision Making Patient with foley catheter with feeling of urinary retention   Amount and/or Complexity of Data Reviewed External Data Reviewed: labs and notes.    Details: Previous notes and labs reviewed  Labs: ordered.    Details: Urine is consistent with mild uTI   Risk Prescription drug management. Risk Details: Full bag of urine, clear and yellow in hue.  Draining appropriately.  Well appearing, appears comfortable.  No signs of sepsis or retention.  Stable for discharge.  Strict return.  Start keflex for UTI.  Culture sent.      Final Clinical Impression(s) / ED Diagnoses Final diagnoses:  Acute cystitis without hematuria   Return for intractable cough, coughing up blood, fevers > 100.4 unrelieved by medication, shortness of breath, intractable vomiting, chest pain, shortness of breath, weakness, numbness, changes in speech, facial asymmetry, abdominal pain, passing out, Inability to tolerate liquids or food, cough, altered mental status or any  concerns. No signs of systemic illness or infection. The patient is nontoxic-appearing on exam and vital signs are within normal limits.  I have reviewed the triage vital signs and the nursing notes. Pertinent labs & imaging results that were available during my care of the patient were reviewed by me and considered in my medical decision making (see chart for details). After history, exam, and medical workup I feel the patient has been appropriately medically screened and is safe for discharge home. Pertinent diagnoses were  discussed with the patient. Patient was given return precautions.  Rx / DC Orders ED Discharge Orders     None         Muaz Shorey, MD 06/14/22 7255

## 2022-06-15 LAB — URINE CULTURE
Culture: NO GROWTH
Special Requests: NORMAL

## 2022-06-23 ENCOUNTER — Other Ambulatory Visit: Payer: Self-pay

## 2022-06-23 ENCOUNTER — Emergency Department (HOSPITAL_BASED_OUTPATIENT_CLINIC_OR_DEPARTMENT_OTHER)
Admission: EM | Admit: 2022-06-23 | Discharge: 2022-06-23 | Disposition: A | Discharge status: Home / Self Care | Payer: BC Managed Care – PPO | Attending: Emergency Medicine | Admitting: Emergency Medicine

## 2022-06-23 ENCOUNTER — Emergency Department (HOSPITAL_BASED_OUTPATIENT_CLINIC_OR_DEPARTMENT_OTHER): Payer: BC Managed Care – PPO

## 2022-06-23 ENCOUNTER — Encounter (HOSPITAL_BASED_OUTPATIENT_CLINIC_OR_DEPARTMENT_OTHER): Payer: Self-pay | Admitting: Emergency Medicine

## 2022-06-23 DIAGNOSIS — M4800 Spinal stenosis, site unspecified: Secondary | ICD-10-CM

## 2022-06-23 DIAGNOSIS — R339 Retention of urine, unspecified: Secondary | ICD-10-CM | POA: Diagnosis present

## 2022-06-23 DIAGNOSIS — M48061 Spinal stenosis, lumbar region without neurogenic claudication: Secondary | ICD-10-CM | POA: Insufficient documentation

## 2022-06-23 LAB — CBC WITH DIFFERENTIAL/PLATELET
Abs Immature Granulocytes: 0.03 10*3/uL (ref 0.00–0.07)
Basophils Absolute: 0 10*3/uL (ref 0.0–0.1)
Basophils Relative: 1 %
Eosinophils Absolute: 0.2 10*3/uL (ref 0.0–0.5)
Eosinophils Relative: 2 %
HCT: 40.9 % (ref 39.0–52.0)
Hemoglobin: 13.7 g/dL (ref 13.0–17.0)
Immature Granulocytes: 0 %
Lymphocytes Relative: 19 %
Lymphs Abs: 1.4 10*3/uL (ref 0.7–4.0)
MCH: 28.4 pg (ref 26.0–34.0)
MCHC: 33.5 g/dL (ref 30.0–36.0)
MCV: 84.9 fL (ref 80.0–100.0)
Monocytes Absolute: 0.7 10*3/uL (ref 0.1–1.0)
Monocytes Relative: 10 %
Neutro Abs: 4.7 10*3/uL (ref 1.7–7.7)
Neutrophils Relative %: 68 %
Platelets: 285 10*3/uL (ref 150–400)
RBC: 4.82 MIL/uL (ref 4.22–5.81)
RDW: 12.9 % (ref 11.5–15.5)
WBC: 7 10*3/uL (ref 4.0–10.5)
nRBC: 0 % (ref 0.0–0.2)

## 2022-06-23 LAB — BASIC METABOLIC PANEL
Anion gap: 12 (ref 5–15)
BUN: 12 mg/dL (ref 6–20)
CO2: 25 mmol/L (ref 22–32)
Calcium: 9.2 mg/dL (ref 8.9–10.3)
Chloride: 103 mmol/L (ref 98–111)
Creatinine, Ser: 0.92 mg/dL (ref 0.61–1.24)
GFR, Estimated: >60 mL/min (ref >60)
Glucose, Bld: 102 mg/dL — ABNORMAL HIGH (ref 70–99)
Potassium: 3.6 mmol/L (ref 3.5–5.1)
Sodium: 140 mmol/L (ref 135–145)

## 2022-06-23 LAB — URINALYSIS, ROUTINE W REFLEX MICROSCOPIC
Bilirubin Urine: NEGATIVE
Glucose, UA: NEGATIVE mg/dL
Hgb urine dipstick: NEGATIVE
Ketones, ur: NEGATIVE mg/dL
Leukocytes,Ua: NEGATIVE
Nitrite: NEGATIVE
Protein, ur: NEGATIVE mg/dL
Specific Gravity, Urine: <1.005 — ABNORMAL LOW (ref 1.005–1.030)
pH: 6.5 (ref 5.0–8.0)

## 2022-06-23 NOTE — ED Provider Notes (Signed)
[facility] EMERGENCY DEPT Provider Note   CSN: [id number] Arrival date & time: 06/23/22  1249     History  Chief Complaint  Patient presents with   Urinary Retention    Timothy Villarreal is a 51 y.o. male.  HPI   Patient presents due to urinary retention.  This is his fourth ED visit for this complaint since 05/31/2022.  Started with inability to eat/decreased urinary emptying.  He initially was having hematuria and some penile discharge.  Catheter in place, started on antibiotics.  Had an episode 06/08/2022, changed to different antibiotic started and catheter placed.  Catheter removed 06/14/2022 and had recurrence of urinary retention.  Catheter was removed earlier this morning and he is again unable to urinate.  Removed around 10 AM which was a few hours prior to arrival.  He is not having any nausea, vomiting, fevers.  No history of back trauma, lateralized weakness or numbness to the lower extremities or history of malignancy.  No anesthesia.  Attributed symptoms to prostatitis.   Bladder scan with 750 mL.  Home Medications Prior to Admission medications   Medication Sig Start Date End Date Taking? Authorizing Provider  amphetamine-dextroamphetamine (ADDERALL) 10 MG tablet Take by mouth.    [provider]  ARMOUR THYROID 120 MG tablet Take 120 mg by mouth every morning. 05/17/22   [provider]  baclofen (LIORESAL) 10 MG tablet Take 0.5-1 tablets (5-10 mg total) by mouth at bedtime as needed for muscle spasms. 02/27/20   Hilts, Legrand Como, MD  cephALEXin (KEFLEX) 500 MG capsule Take 1 capsule (500 mg total) by mouth 4 (four) times daily. 06/14/22   Palumbo, April, MD  desvenlafaxine (PRISTIQ) 100 MG 24 hr tablet Take 100 mg by mouth daily. 04/06/22   [provider]  doxycycline (VIBRAMYCIN) 100 MG capsule Take 100 mg by mouth 2 (two) times daily. 06/10/22   [provider]  JORNAY PM 100 MG CP24 SMARTSIG:1 Capsule(s) By Mouth  Every Evening 06/07/22   [provider]  levothyroxine (SYNTHROID) 175 MCG tablet levothyroxine 175 mcg tablet 09/06/19   [provider]  meloxicam (MOBIC) 15 MG tablet Take 1 tablet (15 mg total) by mouth daily. 04/27/22   Edrick Kins, DPM  methylPREDNISolone (MEDROL DOSEPAK) 4 MG TBPK tablet 6 day dose pack - take as directed 04/27/22   Edrick Kins, DPM  nabumetone (RELAFEN) 500 MG tablet Take 1 tablet (500 mg total) by mouth 2 (two) times daily as needed. 02/27/20   Hilts, Legrand Como, MD  sulfamethoxazole-trimethoprim (BACTRIM DS) 800-160 MG tablet Take 1 tablet by mouth 2 (two) times daily. 06/06/22   [provider]  tamsulosin (FLOMAX) 0.4 MG CAPS capsule Take 1 capsule (0.4 mg total) by mouth daily. 05/31/22 06/30/22  Tretha Sciara, MD      Allergies    Patient has no known allergies.    Review of Systems   Review of Systems  Physical Exam Updated Vital Signs BP 123/82 (BP Location: Right Arm)   Pulse 77   Temp (!) 96.7 F (35.9 C)   Resp 16   Ht '6\' 2"'$  (1.88 m)   Wt 112.5 kg   SpO2 99%   BMI 31.84 kg/m  Physical Exam Vitals and nursing note reviewed. Exam conducted with a chaperone present.  Constitutional:      Appearance: Normal appearance.  HENT:     Head: Normocephalic and atraumatic.  Eyes:     General: No scleral icterus.  Right eye: No discharge.        Left eye: No discharge.     Extraocular Movements: Extraocular movements intact.     Pupils: Pupils are equal, round, and reactive to light.  Cardiovascular:     Rate and Rhythm: Normal rate and regular rhythm.     Pulses: Normal pulses.     Heart sounds: Normal heart sounds. No murmur heard.    No friction rub. No gallop.  Pulmonary:     Effort: Pulmonary effort is normal. No respiratory distress.     Breath sounds: Normal breath sounds.  Abdominal:     General: Abdomen is flat. Bowel sounds are normal. There is no distension.     Palpations: Abdomen is soft.      Tenderness: There is no abdominal tenderness.  Genitourinary:    Comments: Urethra patent, no purulence or surrounding erythema.  Testicles descended bilaterally without any palpable mass.  Paramedic Vonna Kotyk in room as chaperone. Musculoskeletal:        General: Tenderness present.     Comments: Paraspinal tenderness to low back, no specific midline tenderness or palpable step-offs  Skin:    General: Skin is warm and dry.     Coloration: Skin is not jaundiced.  Neurological:     Mental Status: He is alert. Mental status is at baseline.     Coordination: Coordination normal.     Comments: Nerves II through XII are grossly intact.  Upper and lower extremity strength symmetric bilaterally.  Ambulatory with steady gait     ED Results / Procedures / Treatments   Labs (all labs ordered are listed, but only abnormal results are displayed) Labs Reviewed  URINALYSIS, ROUTINE W REFLEX MICROSCOPIC - Abnormal; Notable for the following components:      Result Value   Color, Urine COLORLESS (*)    Specific Gravity, Urine <1.005 (*)    All other components within normal limits  BASIC METABOLIC PANEL - Abnormal; Notable for the following components:   Glucose, Bld 102 (*)    All other components within normal limits  URINE CULTURE  CBC WITH DIFFERENTIAL/PLATELET    EKG None  Radiology CT L-SPINE NO CHARGE  Result Date: 06/23/2022 CLINICAL DATA:  Urinary retention, unable to urinate EXAM: CT LUMBAR SPINE WITHOUT CONTRAST TECHNIQUE: Multidetector CT imaging of the lumbar spine was performed without intravenous contrast administration. Multiplanar CT image reconstructions were also generated. RADIATION DOSE REDUCTION: This exam was performed according to the departmental dose-optimization program which includes automated exposure control, adjustment of the mA and/or kV according to patient size and/or use of iterative reconstruction technique. COMPARISON:  Lumbar spine radiograph done on  02/27/2020, CT done on 09/18/2019 FINDINGS: Segmentation: There are 5 non-rib-bearing vertebrae in lumbar region. Last lumbar vertebra is transitional. Alignment: There is minimal retrolisthesis at L1-L2 and L4-L5 levels with no significant interval change. There is minimal retrolisthesis at L2-L3 level. Vertebrae: No recent fracture is seen. There is pars interarticularis defect on the right side in L4 vertebra. Small sclerotic changes are noted in the bodies of T9 and L2 vertebrae, possibly benign bone islands. Paraspinal and other soft tissues: There is mild spinal stenosis at the L3-L4 level caused by bulging of annulus. There is moderate spinal stenosis at L4-L5 level caused by retrolisthesis, bulging of annulus and facet hypertrophy. Paraspinal soft tissues are unremarkable. Disc levels: There is encroachment of neural foramina from L1-L5 levels. There is interval progression is seen in lumbar spondylosis. IMPRESSION: No recent fracture is seen  in the lumbar spine. Spondylolysis is seen on the right side in L4 vertebra. There is mild retrolisthesis at L1-L2, L2-L3 and L4-L5 levels. Spinal stenosis is seen at multiple levels, more so at L4-L5 level. There is encroachment of neural foramina from L1-L5 levels. Electronically Signed   By: Elmer Picker M.D.   On: 06/23/2022 14:32   CT Renal Stone Study  Result Date: 06/23/2022 CLINICAL DATA:  Urinary retention EXAM: CT ABDOMEN AND PELVIS WITHOUT CONTRAST TECHNIQUE: Multidetector CT imaging of the abdomen and pelvis was performed following the standard protocol without IV contrast. RADIATION DOSE REDUCTION: This exam was performed according to the departmental dose-optimization program which includes automated exposure control, adjustment of the mA and/or kV according to patient size and/or use of iterative reconstruction technique. COMPARISON:  09/18/2019 FINDINGS: Lower chest: Unremarkable. Hepatobiliary: No focal abnormalities are seen in liver. There  is no dilation of bile ducts. Gallbladder is unremarkable. Pancreas: No focal abnormalities are seen. Spleen: Unremarkable. Adrenals/Urinary Tract: Adrenals are unremarkable. There is no hydronephrosis. There is 3.3 cm parapelvic cyst in the upper pole of right kidney. There are no renal or ureteral stones. Urinary bladder is not distended. Foley catheter is seen in the bladder. There is tiny pocket of air in the lumen of the bladder, possibly due to recent instrumentation. Stomach/Bowel: Stomach is unremarkable. Small bowel loops are not dilated. Appendix is not dilated. There is no significant wall thickening in colon. There is no pericolic stranding. Vascular/Lymphatic: Unremarkable. Reproductive: Prostate is enlarged. Other: There is no ascites or pneumoperitoneum. Small umbilical hernia containing fat is seen. Bilateral inguinal hernias containing fat are noted. Musculoskeletal: No acute findings are seen. IMPRESSION: There is no evidence of intestinal obstruction or pneumoperitoneum. There is no hydronephrosis. Appendix is not dilated. There is 3.3 cm parapelvic cyst in the right kidney. There is tiny pocket of air in the lumen of urinary bladder, possibly due to recent instrumentation. Other findings as described in the body of the report. Electronically Signed   By: Elmer Picker M.D.   On: 06/23/2022 14:19    Procedures Procedures    Medications Ordered in ED Medications - No data to display  ED Course/ Medical Decision Making/ A&P                           Medical Decision Making Amount and/or Complexity of Data Reviewed Labs: ordered. Radiology: ordered.   Patient presents with urinary retention.  Differential includes but not limited to BPH, prostatitis, obstructive uropathy, malignancy, UTI, urethritis, cord impingement although less likely given atraumatic, longevity of symptoms.  Do not feel this is likely cauda equina.  Patient's wife is at bedside as an independent  historian.  I also viewed external medical records including previous ED visits and urology note from earlier today.  Reviewed prior imaging, point-of-care bedside ultrasound performed scans.  Bladder scan with 750 mL, proceed with Foley catheter and will obtain UA and urine culture.  Will also check basic labs including renal function, CBC.   I ordered, viewed and interpreted laboratory workup.   CBC without leukocytosis or anemia.   BMP without gross electrolyte derangement or AKI. UA negative for any infectious etiology, urine culture pending.  Given he has been on multiple antibiotics for this over the past month we will abstain from third course of antibiotics until culture is obtained.  CT renal negative for any acute process, there is some signs of enlarged prostate but no hydronephrosis  or obstructive uropathy.  CT lumbar show spinal stenosis, discussed with patient he may benefit from outpatient neurosurgery evaluation if he has been having back pain.  He states he has been having back pain for some number of years which is worse with heavy lifting.  He has not had any new traumas.  Catheter is in place, will have him follow-up closely with urology.  Neurosurgery follow-up provided due to spinal stenosis found on CT lumbar.  Return precautions were discussed with patient verbalized understanding agreement.  Have not seen indications for additional workup or hospitalizations.        Final Clinical Impression(s) / ED Diagnoses Final diagnoses:  Urinary retention  Spinal stenosis, unspecified spinal region    Rx / DC Orders ED Discharge Orders     None         Sherrill Raring, PA-C 06/23/22 1600    Malvin Johns, MD 06/24/22 [phone]

## 2022-06-23 NOTE — ED Notes (Signed)
Pt given leg bags and stickers to secure cath to the leg.

## 2022-06-23 NOTE — ED Notes (Signed)
Discharge paperwork given and verbally understood. 

## 2022-06-23 NOTE — ED Triage Notes (Signed)
Pt arrives to ED with c/o urinary retention. He reports having his catheter removed this morning by urology and has not been able to urinate since.

## 2022-06-23 NOTE — Discharge Instructions (Addendum)
You are seen today in the emergency department for urinary retention.  Kidney function looks normal, will not start you on any antibiotics until the culture comes back given you have been on so many recently.  If the culture grows a bacterial infection we will start you on antibiotics outpatient you will hear a call from the hospital.  Wear the catheter until you are able to seen by urology, call and follow-up with them regarding the continued urinary retention.  CT scan did show some narrowing of your lower back, follow-up with neurosurgery as needed for the symptoms.  Information above.  Return to the ED for fevers, loss of function of your lower limbs, severe chest pain, shortness of breath or new or concerning symptoms.  Narrative  CLINICAL DATA:  Urinary retention    EXAM:  CT ABDOMEN AND PELVIS WITHOUT CONTRAST    TECHNIQUE:  Multidetector CT imaging of the abdomen and pelvis was performed  following the standard protocol without IV contrast.    RADIATION DOSE REDUCTION: This exam was performed according to the  departmental dose-optimization program which includes automated  exposure control, adjustment of the mA and/or kV according to  patient size and/or use of iterative reconstruction technique.    COMPARISON:  09/18/2019    FINDINGS:  Lower chest: Unremarkable.    Hepatobiliary: No focal abnormalities are seen in liver. There is no  dilation of bile ducts. Gallbladder is unremarkable.    Pancreas: No focal abnormalities are seen.    Spleen: Unremarkable.    Adrenals/Urinary Tract: Adrenals are unremarkable. There is no  hydronephrosis. There is 3.3 cm parapelvic cyst in the upper pole of  right kidney. There are no renal or ureteral stones. Urinary bladder  is not distended. Foley catheter is seen in the bladder. There is  tiny pocket of air in the lumen of the bladder, possibly due to  recent instrumentation.    Stomach/Bowel: Stomach is unremarkable. Small bowel loops  are not  dilated. Appendix is not dilated. There is no significant wall  thickening in colon. There is no pericolic stranding.    Vascular/Lymphatic: Unremarkable.    Reproductive: Prostate is enlarged.    Other: There is no ascites or pneumoperitoneum. Small umbilical  hernia containing fat is seen. Bilateral inguinal hernias containing  fat are noted.    Musculoskeletal: No acute findings are seen.    IMPRESSION:  There is no evidence of intestinal obstruction or pneumoperitoneum.  There is no hydronephrosis. Appendix is not dilated.    There is 3.3 cm parapelvic cyst in the right kidney. There is tiny  pocket of air in the lumen of urinary bladder, possibly due to  recent instrumentation.    Other findings as described in the body of the report.      Electronically Signed    By: Elmer Picker M.D.    CT LUMBAR SPINE WITHOUT CONTRAST    TECHNIQUE:  Multidetector CT imaging of the lumbar spine was performed without  intravenous contrast administration. Multiplanar CT image  reconstructions were also generated.    RADIATION DOSE REDUCTION: This exam was performed according to the  departmental dose-optimization program which includes automated  exposure control, adjustment of the mA and/or kV according to  patient size and/or use of iterative reconstruction technique.    COMPARISON:  Lumbar spine radiograph done on 02/27/2020, CT done on  09/18/2019    FINDINGS:  Segmentation: There are 5 non-rib-bearing vertebrae in lumbar  region. Last lumbar vertebra is transitional.  Alignment: There is minimal retrolisthesis at L1-L2 and L4-L5 levels  with no significant interval change. There is minimal retrolisthesis  at L2-L3 level.    Vertebrae: No recent fracture is seen. There is pars  interarticularis defect on the right side in L4 vertebra. Small  sclerotic changes are noted in the bodies of T9 and L2 vertebrae,  possibly benign bone islands.    Paraspinal  and other soft tissues: There is mild spinal stenosis at  the L3-L4 level caused by bulging of annulus. There is moderate  spinal stenosis at L4-L5 level caused by retrolisthesis, bulging of  annulus and facet hypertrophy. Paraspinal soft tissues are  unremarkable.    Disc levels: There is encroachment of neural foramina from L1-L5  levels.    There is interval progression is seen in lumbar spondylosis.    IMPRESSION:  No recent fracture is seen in the lumbar spine. Spondylolysis is  seen on the right side in L4 vertebra. There is mild retrolisthesis  at L1-L2, L2-L3 and L4-L5 levels.    Spinal stenosis is seen at multiple levels, more so at L4-L5 level.  There is encroachment of neural foramina from L1-L5 levels.      Electronically Signed    By: Elmer Picker M.D.    On: 06/23/2022 14:32

## 2022-06-23 NOTE — ED Notes (Signed)
Urinary cath left in place... Request by Provider.Marland KitchenMarland Kitchen

## 2022-06-24 LAB — URINE CULTURE: Culture: NO GROWTH

## 2022-08-04 ENCOUNTER — Other Ambulatory Visit: Payer: Self-pay | Admitting: Urology

## 2022-08-05 NOTE — Progress Notes (Signed)
Sent message, via epic in basket, requesting orders in epic from surgeon.  

## 2022-08-07 NOTE — Patient Instructions (Signed)
SURGICAL WAITING ROOM VISITATION Patients having surgery or a procedure may have no more than 2 support people in the waiting area - these visitors may rotate in the visitor waiting room.   Due to an increase in RSV and influenza rates and associated hospitalizations, children ages 14 and under may not visit patients in [place]. If the patient needs to stay at the hospital during part of their recovery, the visitor guidelines for inpatient rooms apply.  PRE-OP VISITATION  Pre-op nurse will coordinate an appropriate time for 1 support person to accompany the patient in pre-op.  This support person may not rotate.  This visitor will be contacted when the time is appropriate for the visitor to come back in the pre-op area.  Please refer to the [facility] website for the visitor guidelines for Inpatients (after your surgery is over and you are in a regular room).  You are not required to quarantine at this time prior to your surgery. However, you must do this: Hand Hygiene often Do NOT share personal items Notify your provider if you are in close contact with someone who has COVID or you develop fever 100.4 or greater, new onset of sneezing, cough, sore throat, shortness of breath or body aches.  If you test positive for Covid or have been in contact with anyone that has tested positive in the last 10 days please notify you surgeon.    Your procedure is scheduled on:  Monday  August 22, 2022  Report to [facility] Main Entrance: [place] entrance where the [employer] is available.   Report to admitting at:  09:00 AM  +++++Call this number if you have any questions or problems the morning of surgery [phone]  DO NOT EAT OR DRINK ANYTHING AFTER MIDNIGHT THE NIGHT PRIOR TO YOUR SURGERY / PROCEDURE.   FOLLOW BOWEL PREP AND ANY ADDITIONAL PRE OP INSTRUCTIONS YOU RECEIVED FROM YOUR SURGEON'S OFFICE!!!  Use 1 Fleet Enema the night before your surgery according to  your Doctor's instructions.    Oral Hygiene is also important to reduce your risk of infection.        Remember - BRUSH YOUR TEETH THE MORNING OF SURGERY WITH YOUR REGULAR TOOTHPASTE  Do NOT smoke after Midnight the night before surgery.  Take ONLY these medicines the morning of surgery with A SIP OF WATER:  ARMOUR Thyroid   If You have been diagnosed with Sleep Apnea - Bring CPAP mask and tubing day of surgery. We will provide you with a CPAP machine on the day of your surgery.                   You may not have any metal on your body including  jewelry, and body piercing  Do not wear lotions, powders, cologne, or deodorant  Men may shave face and neck.   You may bring a small overnight bag with you on the day of surgery, only pack items that are not valuable. [place] [facility] IS NOT RESPONSIBLE   FOR VALUABLES THAT ARE LOST OR STOLEN.   Do not bring your home medications to the hospital. The Pharmacy will dispense medications listed on your medication list to you during your admission in the Hospital.  Special Instructions: Bring a copy of your healthcare power of attorney and living will documents the day of surgery, if you wish to have them scanned into your [facility] Medical Records- EPIC  Please read over the following fact sheets you were  given: IF YOU HAVE QUESTIONS ABOUT YOUR PRE-OP INSTRUCTIONS, PLEASE CALL [phone]  (KAY)   [facility] - Preparing for Surgery Before surgery, you can play an important role.  Because skin is not sterile, your skin needs to be as free of germs as possible.  You can reduce the number of germs on your skin by washing with CHG (chlorahexidine gluconate) soap before surgery.  CHG is an antiseptic cleaner which kills germs and bonds with the skin to continue killing germs even after washing. Please DO NOT use if you have an allergy to CHG or antibacterial soaps.  If your skin becomes reddened/irritated stop using the CHG and inform your nurse when  you arrive at Short Stay. Do not shave (including legs and underarms) for at least 48 hours prior to the first CHG shower.  You may shave your face/neck.  Please follow these instructions carefully:  1.  Shower with CHG Soap the night before surgery and the  morning of surgery.  2.  If you choose to wash your hair, wash your hair first as usual with your normal  shampoo.  3.  After you shampoo, rinse your hair and body thoroughly to remove the shampoo.                             4.  Use CHG as you would any other liquid soap.  You can apply chg directly to the skin and wash.  Gently with a scrungie or clean washcloth.  5.  Apply the CHG Soap to your body ONLY FROM THE NECK DOWN.   Do not use on face/ open                           Wound or open sores. Avoid contact with eyes, ears mouth and genitals (private parts).                       Wash face,  Genitals (private parts) with your normal soap.             6.  Wash thoroughly, paying special attention to the area where your  surgery  will be performed.  7.  Thoroughly rinse your body with warm water from the neck down.  8.  DO NOT shower/wash with your normal soap after using and rinsing off the CHG Soap.            9.  Pat yourself dry with a clean towel.            10.  Wear clean pajamas.            11.  Place clean sheets on your bed the night of your first shower and do not  sleep with pets.  ON THE DAY OF SURGERY : Do not apply any lotions/deodorants the morning of surgery.  Please wear clean clothes to the hospital/surgery center.    FAILURE TO FOLLOW THESE INSTRUCTIONS MAY RESULT IN THE CANCELLATION OF YOUR SURGERY  PATIENT SIGNATURE_________________________________  NURSE SIGNATURE__________________________________  ________________________________________________________________________

## 2022-08-07 NOTE — Progress Notes (Addendum)
COVID Vaccine received:  '[]'$  No '[x]'$  Yes Date of any COVID positive Test in last 90 days:  PCP - Fonnie Jarvis, NP at [facility] in [place]  Fax) [phone]  Cardiologist - none  Chest x-ray - 09-18-2019 1v  epic EKG -  no hx to warrant Stress Test -  ECHO -  Cardiac Cath -   PCR screen: '[]'$  Ordered & Completed                      '[]'$   No Order but Needs PROFEND                      '[x]'$   N/A for this surgery  Surgery Plan:  '[]'$  Ambulatory                            '[x]'$  Outpatient in bed                            '[]'$  Admit  Anesthesia:    '[x]'$  General  '[]'$  Spinal                           '[]'$   Choice '[]'$   MAC  Bowel Prep - '[]'$  No  '[x]'$   Yes _Fleet enema the night prior to surgery_  Pacemaker / ICD device '[x]'$  No '[]'$  Yes        Device order form faxed '[x]'$  No    '[]'$   Yes      Faxed to:  Spinal Cord Stimulator:'[x]'$  No '[]'$  Yes      (Remind patient to bring remote DOS) Other Implants:   History of Sleep Apnea? '[]'$  No '[x]'$  Yes   CPAP used?- '[]'$  No '[x]'$  Yes    Does the patient monitor blood sugar? '[]'$  No '[]'$  Yes  '[x]'$  N/A  Patient has: '[]'$  Pre-DM   '[]'$  DM1  '[]'$   DM2 Does patient have a [employer] or Dexacom? '[]'$  No '[]'$  Yes   Fasting Blood Sugar Ranges-  Checks Blood Sugar _____ times a day  Blood Thinner / Instructions:   none Aspirin Instructions:  none  ERAS Protocol Ordered: '[x]'$  No  '[]'$  Yes  Patient is to be NPO after: Midnight prior  Comments:   Activity level: Patient can / can not climb a flight of stairs without difficulty; '[]'$  No CP  '[]'$  No SOB, but would have ______   Patient can / can not perform ADLs without assistance.   Anesthesia review: OSA (CPAP), ADHD, s/p Thyroidectomy in 2007 for cancer.   Patient denies shortness of breath, fever, cough and chest pain at PAT appointment.  Patient verbalized understanding and agreement to the Pre-Surgical Instructions that were given to them at this PAT appointment. Patient was also educated of the need to  review these PAT instructions again prior to his/her surgery.I reviewed the appropriate phone numbers to call if they have any and questions or concerns.

## 2022-08-09 ENCOUNTER — Other Ambulatory Visit: Payer: Self-pay

## 2022-08-09 ENCOUNTER — Encounter (HOSPITAL_COMMUNITY): Payer: Self-pay

## 2022-08-09 ENCOUNTER — Encounter (HOSPITAL_COMMUNITY)
Admission: RE | Admit: 2022-08-09 | Discharge: 2022-08-09 | Disposition: A | Discharge status: Home / Self Care | Payer: BC Managed Care – PPO | Source: Ambulatory Visit | Attending: Urology | Admitting: Urology

## 2022-08-09 DIAGNOSIS — Z01812 Encounter for preprocedural laboratory examination: Secondary | ICD-10-CM | POA: Diagnosis present

## 2022-08-09 HISTORY — DX: Hypothyroidism, unspecified: E03.9

## 2022-08-09 HISTORY — DX: Anxiety disorder, unspecified: F41.9

## 2022-08-09 HISTORY — DX: Attention-deficit hyperactivity disorder, unspecified type: F90.9

## 2022-08-09 HISTORY — DX: Unspecified osteoarthritis, unspecified site: M19.90

## 2022-08-09 LAB — CBC
HCT: 44.7 % (ref 39.0–52.0)
Hemoglobin: 14.9 g/dL (ref 13.0–17.0)
MCH: 28.8 pg (ref 26.0–34.0)
MCHC: 33.3 g/dL (ref 30.0–36.0)
MCV: 86.5 fL (ref 80.0–100.0)
Platelets: 274 10*3/uL (ref 150–400)
RBC: 5.17 MIL/uL (ref 4.22–5.81)
RDW: 13 % (ref 11.5–15.5)
WBC: 8.3 10*3/uL (ref 4.0–10.5)
nRBC: 0 % (ref 0.0–0.2)

## 2022-08-09 LAB — BASIC METABOLIC PANEL
Anion gap: 10 (ref 5–15)
BUN: 14 mg/dL (ref 6–20)
CO2: 27 mmol/L (ref 22–32)
Calcium: 9.2 mg/dL (ref 8.9–10.3)
Chloride: 102 mmol/L (ref 98–111)
Creatinine, Ser: 1 mg/dL (ref 0.61–1.24)
GFR, Estimated: >60 mL/min (ref >60)
Glucose, Bld: 90 mg/dL (ref 70–99)
Potassium: 3.8 mmol/L (ref 3.5–5.1)
Sodium: 139 mmol/L (ref 135–145)

## 2022-08-22 ENCOUNTER — Ambulatory Visit (HOSPITAL_COMMUNITY): Payer: BC Managed Care – PPO | Admitting: Anesthesiology

## 2022-08-22 ENCOUNTER — Observation Stay (HOSPITAL_COMMUNITY)
Admission: RE | Admit: 2022-08-22 | Discharge: 2022-08-23 | Disposition: A | Discharge status: Home / Self Care | Payer: BC Managed Care – PPO | Source: Ambulatory Visit | Attending: Urology | Admitting: Urology

## 2022-08-22 ENCOUNTER — Other Ambulatory Visit: Payer: Self-pay

## 2022-08-22 ENCOUNTER — Encounter (HOSPITAL_COMMUNITY)
Admission: RE | Disposition: A | Discharge status: Home / Self Care | Payer: Self-pay | Source: Ambulatory Visit | Attending: Urology

## 2022-08-22 ENCOUNTER — Encounter (HOSPITAL_COMMUNITY): Payer: Self-pay | Admitting: Urology

## 2022-08-22 DIAGNOSIS — Z85850 Personal history of malignant neoplasm of thyroid: Secondary | ICD-10-CM | POA: Insufficient documentation

## 2022-08-22 DIAGNOSIS — R339 Retention of urine, unspecified: Secondary | ICD-10-CM | POA: Insufficient documentation

## 2022-08-22 DIAGNOSIS — A419 Sepsis, unspecified organism: Secondary | ICD-10-CM | POA: Diagnosis not present

## 2022-08-22 DIAGNOSIS — R310 Gross hematuria: Secondary | ICD-10-CM | POA: Insufficient documentation

## 2022-08-22 DIAGNOSIS — N400 Enlarged prostate without lower urinary tract symptoms: Secondary | ICD-10-CM | POA: Diagnosis present

## 2022-08-22 DIAGNOSIS — N401 Enlarged prostate with lower urinary tract symptoms: Secondary | ICD-10-CM | POA: Insufficient documentation

## 2022-08-22 HISTORY — DX: Other complications of anesthesia, initial encounter: T88.59XA

## 2022-08-22 HISTORY — DX: Sleep apnea, unspecified: G47.30

## 2022-08-22 SURGERY — ABLATION, PROSTATE, TRANSURETHRAL, USING WATERJET
Anesthesia: General

## 2022-08-22 MED ORDER — MORPHINE SULFATE (PF) 2 MG/ML IV SOLN: 2.0000 mg | INTRAVENOUS | Status: DC | PRN

## 2022-08-22 MED ORDER — SENNOSIDES-DOCUSATE SODIUM 8.6-50 MG PO TABS
2.0000 | ORAL_TABLET | Freq: Every day | ORAL | Status: DC
  Administered 2022-08-22: 2 via ORAL
  Filled 2022-08-22: qty 2

## 2022-08-22 MED ORDER — SUGAMMADEX SODIUM 500 MG/5ML IV SOLN
INTRAVENOUS | Status: DC | PRN
  Administered 2022-08-22: 500 mg via INTRAVENOUS

## 2022-08-22 MED ORDER — VENLAFAXINE HCL ER 150 MG PO CP24
150.0000 mg | ORAL_CAPSULE | Freq: Every day | ORAL | Status: DC
  Administered 2022-08-22: 150 mg via ORAL
  Filled 2022-08-22: qty 1

## 2022-08-22 MED ORDER — OXYCODONE HCL 5 MG/5ML PO SOLN: 5.0000 mg | Freq: Once | ORAL | Status: DC | PRN

## 2022-08-22 MED ORDER — ONDANSETRON HCL 4 MG/2ML IJ SOLN: 4.0000 mg | Freq: Once | INTRAMUSCULAR | Status: DC | PRN

## 2022-08-22 MED ORDER — ORAL CARE MOUTH RINSE: 15.0000 mL | Freq: Once | OROMUCOSAL | Status: AC

## 2022-08-22 MED ORDER — DEXAMETHASONE SODIUM PHOSPHATE 10 MG/ML IJ SOLN
INTRAMUSCULAR | Status: DC | PRN
  Administered 2022-08-22: 5 mg via INTRAVENOUS

## 2022-08-22 MED ORDER — MIDAZOLAM HCL 2 MG/2ML IJ SOLN
INTRAMUSCULAR | Status: DC | PRN
  Administered 2022-08-22: 2 mg via INTRAVENOUS

## 2022-08-22 MED ORDER — TRIPLE ANTIBIOTIC 3.5-400-5000 EX OINT: 1.0000 | TOPICAL_OINTMENT | Freq: Three times a day (TID) | CUTANEOUS | Status: DC | PRN

## 2022-08-22 MED ORDER — DIPHENHYDRAMINE HCL 50 MG/ML IJ SOLN: 12.5000 mg | Freq: Four times a day (QID) | INTRAMUSCULAR | Status: DC | PRN

## 2022-08-22 MED ORDER — SODIUM CHLORIDE 0.9 % IR SOLN
3000.0000 mL | Status: DC
  Administered 2022-08-22 – 2022-08-23 (×2): 3000 mL

## 2022-08-22 MED ORDER — SODIUM CHLORIDE 0.9 % IV SOLN: INTRAVENOUS | Status: DC

## 2022-08-22 MED ORDER — ZOLPIDEM TARTRATE 5 MG PO TABS: 5.0000 mg | ORAL_TABLET | Freq: Every evening | ORAL | Status: DC | PRN

## 2022-08-22 MED ORDER — FENTANYL CITRATE (PF) 250 MCG/5ML IJ SOLN
INTRAMUSCULAR | Status: AC
  Filled 2022-08-22: qty 5

## 2022-08-22 MED ORDER — ONDANSETRON HCL 4 MG/2ML IJ SOLN: 4.0000 mg | INTRAMUSCULAR | Status: DC | PRN

## 2022-08-22 MED ORDER — LACTATED RINGERS IV SOLN: INTRAVENOUS | Status: DC

## 2022-08-22 MED ORDER — ROCURONIUM BROMIDE 10 MG/ML (PF) SYRINGE
PREFILLED_SYRINGE | INTRAVENOUS | Status: AC
  Filled 2022-08-22: qty 10

## 2022-08-22 MED ORDER — MIDAZOLAM HCL 2 MG/2ML IJ SOLN
INTRAMUSCULAR | Status: AC
  Filled 2022-08-22: qty 2

## 2022-08-22 MED ORDER — ACETAMINOPHEN 325 MG PO TABS: 650.0000 mg | ORAL_TABLET | ORAL | Status: DC | PRN

## 2022-08-22 MED ORDER — LIDOCAINE 2% (20 MG/ML) 5 ML SYRINGE
INTRAMUSCULAR | Status: DC | PRN
  Administered 2022-08-22: 100 mg via INTRAVENOUS

## 2022-08-22 MED ORDER — CHLORHEXIDINE GLUCONATE CLOTH 2 % EX PADS: 6.0000 | MEDICATED_PAD | Freq: Every day | CUTANEOUS | Status: DC

## 2022-08-22 MED ORDER — PROPOFOL 10 MG/ML IV BOLUS
INTRAVENOUS | Status: AC
  Filled 2022-08-22: qty 20

## 2022-08-22 MED ORDER — ACETAMINOPHEN 10 MG/ML IV SOLN: 1000.0000 mg | Freq: Once | INTRAVENOUS | Status: DC | PRN

## 2022-08-22 MED ORDER — METHYLPHENIDATE HCL ER (PM) 100 MG PO CP24: 100.0000 mg | ORAL_CAPSULE | ORAL | Status: DC

## 2022-08-22 MED ORDER — THYROID 60 MG PO TABS
120.0000 mg | ORAL_TABLET | Freq: Every morning | ORAL | Status: DC
  Filled 2022-08-22: qty 2

## 2022-08-22 MED ORDER — HYDROCODONE-ACETAMINOPHEN 5-325 MG PO TABS: 1.0000 | ORAL_TABLET | ORAL | 0 refills | Status: AC | PRN

## 2022-08-22 MED ORDER — SODIUM CHLORIDE 0.9 % IR SOLN
Status: DC | PRN
  Administered 2022-08-22: 12000 mL via INTRAVESICAL

## 2022-08-22 MED ORDER — LIDOCAINE HCL (PF) 2 % IJ SOLN
INTRAMUSCULAR | Status: AC
  Filled 2022-08-22: qty 5

## 2022-08-22 MED ORDER — FENTANYL CITRATE (PF) 250 MCG/5ML IJ SOLN
INTRAMUSCULAR | Status: DC | PRN
  Administered 2022-08-22: 100 ug via INTRAVENOUS
  Administered 2022-08-22: 50 ug via INTRAVENOUS
  Administered 2022-08-22: 100 ug via INTRAVENOUS

## 2022-08-22 MED ORDER — FLEET ENEMA 7-19 GM/118ML RE ENEM: 1.0000 | ENEMA | Freq: Once | RECTAL | Status: AC

## 2022-08-22 MED ORDER — AMISULPRIDE (ANTIEMETIC) 5 MG/2ML IV SOLN: 10.0000 mg | Freq: Once | INTRAVENOUS | Status: DC | PRN

## 2022-08-22 MED ORDER — DIPHENHYDRAMINE HCL 12.5 MG/5ML PO ELIX: 12.5000 mg | ORAL_SOLUTION | Freq: Four times a day (QID) | ORAL | Status: DC | PRN

## 2022-08-22 MED ORDER — ONDANSETRON HCL 4 MG/2ML IJ SOLN
INTRAMUSCULAR | Status: DC | PRN
  Administered 2022-08-22: 4 mg via INTRAVENOUS

## 2022-08-22 MED ORDER — HYDROMORPHONE HCL 1 MG/ML IJ SOLN: 0.2500 mg | INTRAMUSCULAR | Status: DC | PRN

## 2022-08-22 MED ORDER — CHLORHEXIDINE GLUCONATE 0.12 % MT SOLN
15.0000 mL | Freq: Once | OROMUCOSAL | Status: AC
  Administered 2022-08-22: 15 mL via OROMUCOSAL

## 2022-08-22 MED ORDER — CEFAZOLIN SODIUM-DEXTROSE 2-4 GM/100ML-% IV SOLN
2.0000 g | INTRAVENOUS | Status: AC
  Administered 2022-08-22: 2 g via INTRAVENOUS
  Filled 2022-08-22: qty 100

## 2022-08-22 MED ORDER — PROPOFOL 10 MG/ML IV BOLUS
INTRAVENOUS | Status: DC | PRN
  Administered 2022-08-22: 200 mg via INTRAVENOUS

## 2022-08-22 MED ORDER — OXYCODONE HCL 5 MG PO TABS: 5.0000 mg | ORAL_TABLET | Freq: Once | ORAL | Status: DC | PRN

## 2022-08-22 MED ORDER — EPHEDRINE SULFATE-NACL 50-0.9 MG/10ML-% IV SOSY
PREFILLED_SYRINGE | INTRAVENOUS | Status: DC | PRN
  Administered 2022-08-22: 5 mg via INTRAVENOUS

## 2022-08-22 MED ORDER — ACETAMINOPHEN 10 MG/ML IV SOLN
INTRAVENOUS | Status: AC
  Administered 2022-08-22: 1000 mg via INTRAVENOUS
  Filled 2022-08-22: qty 100

## 2022-08-22 MED ORDER — OXYBUTYNIN CHLORIDE 5 MG PO TABS: 5.0000 mg | ORAL_TABLET | Freq: Three times a day (TID) | ORAL | Status: DC | PRN

## 2022-08-22 MED ORDER — ROCURONIUM BROMIDE 10 MG/ML (PF) SYRINGE
PREFILLED_SYRINGE | INTRAVENOUS | Status: DC | PRN
  Administered 2022-08-22: 70 mg via INTRAVENOUS
  Administered 2022-08-22: 30 mg via INTRAVENOUS

## 2022-08-22 MED ORDER — HYDROMORPHONE HCL 1 MG/ML IJ SOLN
INTRAMUSCULAR | Status: AC
  Administered 2022-08-22: 0.5 mg via INTRAVENOUS
  Filled 2022-08-22: qty 1

## 2022-08-22 MED ORDER — OXYCODONE HCL 5 MG PO TABS
5.0000 mg | ORAL_TABLET | ORAL | Status: DC | PRN
  Administered 2022-08-22: 5 mg via ORAL
  Filled 2022-08-22: qty 1

## 2022-08-22 MED ORDER — 0.9 % SODIUM CHLORIDE (POUR BTL) OPTIME
TOPICAL | Status: DC | PRN
  Administered 2022-08-22: 1000 mL

## 2022-08-22 SURGICAL SUPPLY — 26 items
BAG DRN RND TRDRP ANRFLXCHMBR (UROLOGICAL SUPPLIES) ×1
BAG URINE DRAIN 2000ML AR STRL (UROLOGICAL SUPPLIES) ×1 IMPLANT
CANISTER SUCT 3000ML PPV (MISCELLANEOUS) ×1 IMPLANT
CATH HEMA 3WAY 30CC 22FR COUDE (CATHETERS) IMPLANT
CATH HEMA 3WAY 30CC 24FR COUDE (CATHETERS) IMPLANT
COVER MAYO STAND STRL (DRAPES) ×1 IMPLANT
DRAPE FOOT SWITCH (DRAPES) ×1 IMPLANT
GEL ULTRASOUND 8.5O AQUASONIC (MISCELLANEOUS) ×1 IMPLANT
GLOVE SURG LX STRL 7.5 STRW (GLOVE) ×1 IMPLANT
GOWN STRL REUS W/ TWL XL LVL3 (GOWN DISPOSABLE) ×1 IMPLANT
GOWN STRL REUS W/TWL XL LVL3 (GOWN DISPOSABLE) ×2
HANDPIECE AQUABEAM (MISCELLANEOUS) ×1 IMPLANT
HOLDER FOLEY CATH W/STRAP (MISCELLANEOUS) IMPLANT
LOOP CUT BIPOLAR 24F LRG (ELECTROSURGICAL) IMPLANT
MANIFOLD NEPTUNE II (INSTRUMENTS) ×1 IMPLANT
MAT ABSORB  FLUID 56X50 GRAY (MISCELLANEOUS) ×1
MAT ABSORB FLUID 56X50 GRAY (MISCELLANEOUS) ×1 IMPLANT
PACK CYSTO (CUSTOM PROCEDURE TRAY) ×1 IMPLANT
PACK DRAPE AQUABEAM (MISCELLANEOUS) ×1 IMPLANT
SYR 30ML LL (SYRINGE) ×1 IMPLANT
SYR TOOMEY IRRIG 70ML (MISCELLANEOUS) ×2
SYRINGE TOOMEY IRRIG 70ML (MISCELLANEOUS) ×2 IMPLANT
TOWEL OR 17X24 6PK STRL BLUE (TOWEL DISPOSABLE) ×1 IMPLANT
TUBING CONNECTING 10 (TUBING) ×1 IMPLANT
TUBING UROLOGY SET (TUBING) ×1 IMPLANT
UNDERPAD 30X36 HEAVY ABSORB (UNDERPADS AND DIAPERS) ×1 IMPLANT

## 2022-08-22 NOTE — Discharge Instructions (Signed)
Care After  Refer to this sheet in the next few weeks. These discharge instructions provide you with general information on caring for yourself after you leave the hospital. Your caregiver may also give you specific instructions. Your treatment has been planned according to the most current medical practices available, but unavoidable complications sometimes occur. If you have any problems or questions after discharge, please call your caregiver.  HOME CARE INSTRUCTIONS   Medications You may receive medicine for pain management. As your level of discomfort decreases, adjustments in your pain medicines may be made.  Take all medicines as directed.  You may be given a medicine (antibiotic) to kill germs following surgery. Finish all medicines. Let your caregiver know if you have any side effects or problems from the medicine.  If you are on aspirin, it would be best not to restart the aspirin until the blood in the urine clears Hygiene You can take a shower after surgery.  You should not take a bath while you still have the urethral catheter. Activity You will be encouraged to get out of bed as much as possible and increase your activity level as tolerated.  Spend the first week in and around your home. For 3 weeks, avoid the following:  Straining.  Running.  Strenuous work.  Walks longer than a few blocks.  Riding for extended periods.  Sexual relations.  Do not lift heavy objects (more than 20 pounds) for at least 1 month. When lifting, use your arms instead of your abdominal muscles.  You will be encouraged to walk as tolerated. Do not exert yourself. Increase your activity level slowly. Remember that it is important to keep moving after an operation of any type. This cuts down on the possibility of developing blood clots.  Your caregiver will tell you when you can resume driving and light housework. Discuss this at your first office visit after discharge. Diet No special diet is  ordered after a TURP. However, if you are on a special diet for another medical problem, it should be continued.  Normal fluid intake is usually recommended.  Avoid alcohol and caffeinated drinks for 2 weeks. They irritate the bladder. Decaffeinated drinks are okay.  Avoid spicy foods.  Bladder Function For the first 10 days, empty the bladder whenever you feel a definite desire. Do not try to hold the urine for long periods of time.  Urinating once or twice a night even after you are healed is not uncommon.  You may see some recurrence of blood in the urine after discharge from the hospital. This usually happens within 2 weeks after the procedure.If this occurs, force fluids again as you did in the hospital and reduce your activity.  Bowel Function You may experience some constipation after surgery. This can be minimized by increasing fluids and fiber in your diet. Drink enough water and fluids to keep your urine clear or pale yellow.  A stool softener may be prescribed for use at home. Do not strain to move your bowels.  If you are requiring increased pain medicine, it is important that you take stool softeners to prevent constipation. This will help to promote proper healing by reducing the need to strain to move your bowels.  Sexual Activity Semen movement in the opposite direction and into the bladder (retrograde ejaculation) may occur. Since the semen passes into the bladder, cloudy urine can occur the first time you urinate after intercourse. Or, you may not have an ejaculation during erection. Ask your caregiver  when you can resume sexual activity. Retrograde ejaculation and reduced semen discharge should not reduce one's pleasure of intercourse.  Postoperative Visit Arrange the date and time of your after surgery visit with your caregiver.  Return to Work After your recovery is complete, you will be able to return to work and resume all activities. Your caregiver will inform you when you  can return to work.    Foley Catheter Care A soft, flexible tube (Foley catheter) may have been placed in your bladder to drain urine and fluid. Follow these instructions: Taking Care of the Catheter Keep the area where the catheter leaves your body clean.  Attach the catheter to the leg so there is no tension on the catheter.  Keep the drainage bag below the level of the bladder, but keep it OFF the floor.  Do not take long soaking baths. Your caregiver will give instructions about showering.  Wash your hands before touching ANYTHING related to the catheter or bag.  Using mild soap and warm water on a washcloth:  Clean the area closest to the catheter insertion site using a circular motion around the catheter.  Clean the catheter itself by wiping AWAY from the insertion site for several inches down the tube.  NEVER wipe upward as this could sweep bacteria up into the urethra (tube in your body that normally drains the bladder) and cause infection.  Place a small amount of sterile lubricant at the tip of the penis where the catheter is entering.  Taking Care of the Drainage Bags Two drainage bags may be taken home: a large overnight drainage bag, and a smaller leg bag which fits underneath clothing.  It is okay to wear the overnight bag at any time, but NEVER wear the smaller leg bag at night.  Keep the drainage bag well below the level of your bladder. This prevents backflow of urine into the bladder and allows the urine to drain freely.  Anchor the tubing to your leg to prevent pulling or tension on the catheter. Use tape or a leg strap provided by the hospital.  Empty the drainage bag when it is 1/2 to 3/4 full. Wash your hands before and after touching the bag.  Periodically check the tubing for kinks to make sure there is no pressure on the tubing which could restrict the flow of urine.  Changing the Drainage Bags Cleanse both ends of the clean bag with alcohol before changing.  Pinch  off the rubber catheter to avoid urine spillage during the disconnection.  Disconnect the dirty bag and connect the clean one.  Empty the dirty bag carefully to avoid a urine spill.  Attach the new bag to the leg with tape or a leg strap.  Cleaning the Drainage Bags Whenever a drainage bag is disconnected, it must be cleaned quickly so it is ready for the next use.  Wash the bag in warm, soapy water.  Rinse the bag thoroughly with warm water.  Soak the bag for 30 minutes in a solution of white vinegar and water (1 cup vinegar to 1 quart warm water).  Rinse with warm water.  SEEK MEDICAL CARE IF:  You have chills or night sweats.  You are leaking around your catheter or have problems with your catheter. It is not uncommon to have sporadic leakage around your catheter as a result of bladder spasms. If the leakage stops, there is not much need for concern. If you are uncertain, call your caregiver.  You develop  side effects that you think are coming from your medicines.  SEEK IMMEDIATE MEDICAL CARE IF:  You are suddenly unable to urinate. Check to see if there are any kinks in the drainage tubing that may cause this. If you cannot find any kinks, call your caregiver immediately. This is an emergency.  You develop shortness of breath or chest pains.  Bleeding persists or clots develop in your urine.  You have a fever.  You develop pain in your back or over your lower belly (abdomen).  You develop pain or swelling in your legs.  Any problems you are having get worse rather than better.  MAKE SURE YOU:  Understand these instructions.  Will watch your condition.  Will get help right away if you are not doing well or get worse.

## 2022-08-22 NOTE — Transfer of Care (Signed)
Immediate Anesthesia Transfer of Care Note  Patient: VIVIN FLY  Procedure(s) Performed: TRANSURETHRAL WATERJET ABLATION OF PROSTATE  Patient Location: PACU  Anesthesia Type:General  Level of Consciousness: awake, alert , oriented, and patient cooperative  Airway & Oxygen Therapy: Patient Spontanous Breathing and Patient connected to face mask oxygen  Post-op Assessment: Report given to RN and Post -op Vital signs reviewed and stable  Post vital signs: Reviewed and stable  Last Vitals:  Vitals Value Taken Time  BP 124/89 08/22/22 1312  Temp 36.6 C 08/22/22 1312  Pulse 81 08/22/22 1313  Resp 14 08/22/22 1313  SpO2 98 % 08/22/22 1313  Vitals shown include unvalidated device data.  Last Pain:  Vitals:   08/22/22 0913  PainSc: 1       Patients Stated Pain Goal: 4 ([id number])  Complications: No notable events documented.

## 2022-08-22 NOTE — Anesthesia Postprocedure Evaluation (Signed)
Anesthesia Post Note  Patient: Timothy Villarreal  Procedure(s) Performed: TRANSURETHRAL WATERJET ABLATION OF PROSTATE     Patient location during evaluation: PACU Anesthesia Type: General Level of consciousness: awake and alert Pain management: pain level controlled Vital Signs Assessment: post-procedure vital signs reviewed and stable Respiratory status: spontaneous breathing, nonlabored ventilation, respiratory function stable and patient connected to nasal cannula oxygen Cardiovascular status: blood pressure returned to baseline and stable Postop Assessment: no apparent nausea or vomiting Anesthetic complications: no  No notable events documented.  Last Vitals:  Vitals:   08/22/22 1400 08/22/22 1419  BP: 114/75 131/87  Pulse: 70 74  Resp: 13 20  Temp:  36.6 C  SpO2: 100% 98%    Last Pain:  Vitals:   08/22/22 1419  TempSrc: Oral  PainSc:                  Barnet Glasgow

## 2022-08-22 NOTE — Anesthesia Procedure Notes (Signed)
Procedure Name: Intubation Date/Time: 08/22/2022 11:50 AM  Performed by: Lollie Sails, CRNAPre-anesthesia Checklist: Patient identified, Emergency Drugs available, Suction available, Patient being monitored and Timeout performed Patient Re-evaluated:Patient Re-evaluated prior to induction Oxygen Delivery Method: Circle system utilized Preoxygenation: Pre-oxygenation with 100% oxygen Induction Type: IV induction Ventilation: Oral airway inserted - appropriate to patient size and Two handed mask ventilation required Laryngoscope Size: Miller and 3 Grade View: Grade I Tube type: Oral Tube size: 7.5 mm Number of attempts: 1 Airway Equipment and Method: Stylet Placement Confirmation: ETT inserted through vocal cords under direct vision, positive ETCO2 and breath sounds checked- equal and bilateral Secured at: 24 cm Tube secured with: Tape Dental Injury: Teeth and Oropharynx as per pre-operative assessment  Comments: Ventilation difficult due to leakage with beard.

## 2022-08-22 NOTE — H&P (Signed)
CC/HPI: CC: Erectile dysfunction, low testosterone  HPI:  52 year old male was evaluated about a year ago when he was having some voiding symptoms and fever. CT scan was performed that showed a cyst but no other genitourinary abnormalities Other than maybe a punctate nonobstructing right renal calculus. he was concerned about hematuria. He has never had gross hematuria. He brought his labs with him on his phone and pulled them up. He had a dipstick urinalysis that showed trace blood. No microscopic analysis. Today, he had 1+ blood on urine dipstick but microscopic analysis was negative. This suggests that he has dipstick positive microscopic hematuria which is a false positive. He most recent PSA on 05/23/2020 was 1.17. He also had his testosterone checked and the 1st 2 values were in the 200s. Most recent value was 305. He is not particularly interested in trying medication at this time for low testosterone. He does have decreased libido compared to when he was younger. He would like to try diet, exercise, weight loss 1st. He is also experiencing some erectile dysfunction about 50% of the time during sexual activity. He denies nitrates for chest pain. He would like to try an oral PDE 5 inhibitor   06/14/2022  In regards to the above history, the patient started exercising and feels like his symptoms improved. However he comes in now with a 3-week history of urinary complaints. Was initially seen at urgent care on 11/28 and was prescribed Omnicef and tamsulosin. At that time, he was having significant voiding complaints. He had leukocytosis of 16.4 with normal renal function and urinalysis positive for 6-10 RBC, 21-50 WBC, trace leukocyte, negative nitrite. No urine culture was performed. He saw his primary care physician on 12/1. He was again seen on 12/5 in the emergency department. PSA was noted to be 21 with some leukocytosis consistent with prostatitis. Per that note, he was to continue Bactrim and  tamsulosin but increase the tamsulosin to twice daily. He was again seen on 12/6. He was found to have bladder scan greater than 400 cc. Foley catheter was placed. He was seen again last night/this morning in the emergency department with concerns for Foley catheter issues. He was started on Keflex. Patient came in today with a number of prescriptions for antibiotics including doxycycline for 30 days, Bactrim for 30 days, ciprofloxacin for 7 days. Looks like all of the medication bottles were full. Unclear what antibiotics he has and has not really taken. He was not really sure. He denies any fever. Continues with Foley catheter. He does state that Flomax 0.8 mg gives him side effects and he has lightheadedness and dizziness on it. He has a scheduled voiding trial in about a week at [facility] urology.   07/05/2022  Patient underwent urodynamics. He was able to generate a voluntary contraction. Remains in retention. He was only able to void 70 mL at a flow of 1 mL/s. Had a prostate ultrasound today that showed a prostate size of 80 g. Has not had a repeat PSA. PSA was 21 but he was having retention with prostatitis symptoms.   07/27/2022  Patient status post transrectal ultrasound-guided biopsy. Prostate sizing by me showed a prostate size of 52 g. Fortunately, biopsy results were negative for infection and positive for focal and acute chronic inflammation. Patient remains in retention. After discussion of options he wants to proceed with aqua ablation.   08/18/2022:  Patient presents acutely with concerns of possible UTI. He continues on tamsulosin. Patient is scheduled for Aquablation on  08/22/2022, and is anxious about having it canceled due to possible UTI. He continues with his Foley catheter, which is nonbothersome. However, he states that he has recently had intermittent clusters of clots with gross hematuria, with mucus at meatus, and some bladder spasms. He otherwise denies frank dysuria, suprapubic  discomfort, flank pain, fever/chills, nausea/vomiting.     ALLERGIES: No Known Drug Allergies    MEDICATIONS: Tamsulosin Hcl 0.4 mg capsule 1 capsule PO Daily  Armour Thyroid  Jornay Pm  Pristiq  Synthroid  Vyvanse     GU PSH: Complex cystometrogram, w/ void pressure and urethral pressure profile studies, any technique - 07/01/2022 Complex Uroflow - 07/01/2022 Cystoscopy - 07/27/2022 Emg surf Electrd - 07/01/2022 Inject For cystogram - 07/01/2022 Intrabd voidng Press - 07/01/2022 Prostate Needle Biopsy - 07/20/2022     NON-GU PSH: Back surgery, removal Surgical Pathology, Gross And Microscopic Examination For Prostate Needle - 07/20/2022 Thyroid Surgery     GU PMH: BPH w/LUTS - 07/27/2022, - 07/20/2022, - 07/05/2022, - 06/14/2022 Elevated PSA - 07/27/2022, - 07/20/2022, - 07/05/2022 Urinary Retention - 07/27/2022, - 07/20/2022, - 07/05/2022, - 07/01/2022, - 06/14/2022 Acute prostatitis - 06/14/2022 ED due to arterial insufficiency - 2022 Primary hypogonadism - 2022    NON-GU PMH: Depression Personal history of malignant neoplasm of thyroid Sleep Apnea    Immunizations: None   FAMILY HISTORY: 1 Daughter - Other 1 son - Other copd - Mother   SOCIAL HISTORY: Marital Status: Unknown Preferred Language: English; Race: White Current Smoking Status: Patient has never smoked.   Tobacco Use Assessment Completed: Used Tobacco in last 30 days? Drinks 4+ caffeinated drinks per day.    REVIEW OF SYSTEMS:    GU Review Male:   Patient denies frequent urination, hard to postpone urination, burning/ pain with urination, get up at night to urinate, leakage of urine, stream starts and stops, trouble starting your stream, have to strain to urinate , erection problems, and penile pain.  Gastrointestinal (Upper):   Patient denies nausea, vomiting, and indigestion/ heartburn.  Gastrointestinal (Lower):   Patient denies diarrhea and constipation.  Constitutional:   Patient denies fever, night  sweats, weight loss, and fatigue.  Skin:   Patient denies skin rash/ lesion and itching.  Eyes:   Patient denies blurred vision and double vision.  Ears/ Nose/ Throat:   Patient denies sore throat and sinus problems.  Hematologic/Lymphatic:   Patient denies swollen glands and easy bruising.  Cardiovascular:   Patient denies leg swelling and chest pains.  Respiratory:   Patient denies cough and shortness of breath.  Endocrine:   Patient denies excessive thirst.  Musculoskeletal:   Patient denies back pain and joint pain.  Neurological:   Patient denies headaches and dizziness.  Psychologic:   Patient denies depression and anxiety.   VITAL SIGNS:      08/18/2022 03:29 PM  Weight 245 lb / 111.13 kg  Height 74 in / 187.96 cm  BP 143/89 mmHg  Pulse 82 /min  Temperature 97.8 F / 36.5 C  BMI 31.5 kg/m   GU PHYSICAL EXAMINATION:      Notes: Penile Foley catheter draining clear, yellow urine.   MULTI-SYSTEM PHYSICAL EXAMINATION:    Constitutional: Well-nourished. No physical deformities. Normally developed. Good grooming.  Respiratory: No labored breathing, no use of accessory muscles.   Cardiovascular: Normal temperature, normal extremity pulses, no swelling.   Skin: No paleness, no jaundice, no cyanosis.  Neurologic / Psychiatric: Oriented to time, oriented to place, oriented to person. No  depression, no anxiety, no agitation.   Gastrointestinal: No mass, no suprapubic or bilateral CVA tenderness, no rigidity, non obese abdomen.      Complexity of Data:  Source Of History:  Patient, Medical Record Summary  Records Review:   Previous Doctor Records, Previous Patient Records  Urine Test Review:   Urinalysis   07/05/22  PSA  Total PSA 4.61 ng/mL    08/18/22  Urinalysis  Urine Appearance Cloudy   Urine Color Yellow   Urine Glucose Neg mg/dL  Urine Bilirubin Neg mg/dL  Urine Ketones Neg mg/dL  Urine Specific Gravity 1.020   Urine Blood 3+ ery/uL  Urine pH 7.5   Urine Protein  1+ mg/dL  Urine Urobilinogen 0.2 mg/dL  Urine Nitrites Neg   Urine Leukocyte Esterase 3+ leu/uL  Urine WBC/hpf >60/hpf   Urine RBC/hpf 3 - 10/hpf   Urine Epithelial Cells NS (Not Seen)   Urine Bacteria Few (10-25/hpf)   Urine Mucous Not Present   Urine Yeast NS (Not Seen)   Urine Trichomonas Not Present   Urine Cystals Amorph Phosphates   Urine Casts NS (Not Seen)   Urine Sperm Not Present    PROCEDURES:          Urinalysis w/Scope Dipstick Dipstick Cont'd Micro  Color: Yellow Bilirubin: Neg mg/dL WBC/hpf: >60/hpf  Appearance: Cloudy Ketones: Neg mg/dL RBC/hpf: 3 - 10/hpf  Specific Gravity: 1.020 Blood: 3+ ery/uL Bacteria: Few (10-25/hpf)  pH: 7.5 Protein: 1+ mg/dL Cystals: Amorph Phosphates  Glucose: Neg mg/dL Urobilinogen: 0.2 mg/dL Casts: NS (Not Seen)    Nitrites: Neg Trichomonas: Not Present    Leukocyte Esterase: 3+ leu/uL Mucous: Not Present      Epithelial Cells: NS (Not Seen)      Yeast: NS (Not Seen)      Sperm: Not Present    ASSESSMENT:      ICD-10 Details  1 GU:   Gross hematuria - R31.0 Undiagnosed New Problem   PLAN:            Medications New Meds: Cipro 500 mg tablet 1 tablet PO BID   #14  0 Refill(s)  Pharmacy Name:  [id number] Address:  [address] [place] [address] Phone:  [phone] Fax:  [phone]           Orders Labs Urine Culture CATH          Schedule Return Visit/Planned Activity: Keep Scheduled Appointment - Schedule Surgery          Document Letter(s):  Created for Patient: Clinical Summary         Notes:   Today, cath UA specimen with leukocytosis, red blood cells few bacteria. Out of an abundance of caution, we will send for culture and follow-up with patient as appropriate based on C&S report. With upcoming surgery on Monday, we will initiate empiric therapy with Cipro.   Will have patient maintain his appointment on Monday. Should his presentation worsen over the weekend, he is advised  to be seen at urgent care or ED, and to inform surgical staff at his earliest convenience. Continue on tamsulosin. Patient voiced understanding and is amenable to this plan.        Next Appointment:      Next Appointment: 08/22/2022 11:15 AM    Appointment Type: Surgery     Location: [facility] -[phone]   Provider: ELink Snuffer III, M.D.    Reason for Visit: OBS WL  AQUABLATION     Signed by Ashok Norris, PA-C on 08/18/22 at 11:02 PM (EST

## 2022-08-22 NOTE — Op Note (Signed)
Preoperative diagnosis: BPH with lower urinary tract symptoms, urinary retention  Postoperative diagnosis: Same   Procedure: Robotic water jet ablation of the prostate   Surgeon: Link Snuffer, MD  Resident: Hinton Rao  Anesthesia: General   Indication for procedure: 52 year old male with BPH with urinary retention presents for the previously mentioned operation.  Findings:  Cystoscopy revealed  Description of procedure:  He was brought to the operating room and placed supine on the operating table.  After adequate anesthesia he was placed lithotomy position. Timeout was performed to confirm the patient and procedure. The TRUS Stepper was mounted to the Articulating Arm and secured to OR bed. The ultrasound probe was attached to the stepper. Exam under anesthesia was performed and the TRUS was inserted per rectum.  There was no resistance. The ultrasound probe was aligned, and confirmation made that the prostate is centered and aligned using both transverse and sagittal views. The bladder neck, verumontanum and the central/transition zones were identified.  Genitalia were prepped and draped in the usual sterile fashion. The 62F AQUABEAM Handpiece is inserted into the prostatic urethra and a complete cystoscopic evaluation was performed by inspecting the prostate, bladder, and identifying the location of the verumontanum/external sphincter. The AQUABEAM Handpiece was secured to the Handpiece Articulating Arm. Confirmed alignment of AQUABEAM Handpiece and TRUS Probe to be parallel and colinear. Confirmation that AQUABEAM nozzle is centered and anterior of the bladder neck or the median lobe. The cystoscope was then retracted to visualize the verumontanum and external sphincter and the cystoscope tip was positioned just proximal to the external sphincter. Reconfirmed alignment of the TRUS probe with the AQUABEAM Handpiece and compression applied with TRUS probe. Horizontal alignment of the Handpiece  waterjet nozzle was performed. The Aquablation treatment zones were planned utilizing real-time TRUS to visualize the contour of the prostate and the depth and radial angles of resection were defined in the transverse view. In the sagittal view, the AQUABEAM nozzle is identified and position registered with software. The treatment contours were then adjusted to conform to the intended resection margins. The median lobe, bladder neck and verumontanum were marked and confirmed in the treatment contour. The Aquablation Treatment was then started following the resection contour confirmed under ultrasound guidance. TOTAL AQUABLATION RESECTION TIME: 4 minutes, 58 seconds Once Aquablation resection was complete the 24 French aqua beam handpiece was carefully removed.  The continuous-flow sheath with the visual obturator was passed and then the loop and handle.  The trigone and the ureteral orifices were identified.  Resection of some of the residual median lobe and bladder neck tissue was done.  The bladder neck was identified at 6:00 and this was taken up to 12:00 with fulguration of the bladder neck and prostate for hemostasis.  Slight amount of anterior tissue was resected.  Similarly from 6:00 up to 12:00 on the left side of the bladder neck was identified by resecting some of the ablated tissue to identify the bladder neck and cauterize any bleeding.  Some anterior tissue on the left was resected.  This created excellent hemostasis.  All the chips were evacuated.  Ureteral orifices again identified and noted to be normal without injury.  The scope was backed out and a 24 [place] hematuria catheter was placed with 30 cc in the balloon.  The balloon was seated at the bladder neck and it was irrigated on light traction and noted to be clear to pink.  He was hooked up to CBI.  He was cleaned up and placed  supine.  Catheter was placed on traction.  He was awakened and taken to the cover room in stable  condition.  Complications: None  Blood loss: 100 mL  Specimens: None  Drains: 24 [place] three-way hematuria catheter with 30 cc in the balloon  Disposition: Patient stable to PACU

## 2022-08-22 NOTE — Anesthesia Preprocedure Evaluation (Addendum)
Anesthesia Evaluation  Patient identified by MRN, date of birth, ID band Patient awake    Reviewed: Allergy & Precautions, NPO status , Patient's Chart, lab work & pertinent test results  Airway Mallampati: II  TM Distance: >3 FB Neck ROM: Full    Dental no notable dental hx. (+) Teeth Intact, Dental Advisory Given   Pulmonary sleep apnea and Continuous Positive Airway Pressure Ventilation    Pulmonary exam normal breath sounds clear to auscultation       Cardiovascular Normal cardiovascular exam Rhythm:Regular Rate:Normal     Neuro/Psych  PSYCHIATRIC DISORDERS Anxiety        GI/Hepatic   Endo/Other  Hypothyroidism  S/P thyroid CA  Renal/GU Lab Results      Component                Value               Date                      CREATININE               1.00                08/09/2022                BUN                      14                  08/09/2022                NA                       139                 08/09/2022                K                        3.8                 08/09/2022                CL                       102                 08/09/2022                CO2                      27                  08/09/2022                Musculoskeletal  (+) Arthritis ,    Abdominal   Peds  Hematology Lab Results      Component                Value               Date                      WBC  8.3                 08/09/2022                HGB                      14.9                08/09/2022                HCT                      44.7                08/09/2022                MCV                      86.5                08/09/2022                PLT                      274                 08/09/2022              Anesthesia Other Findings   Reproductive/Obstetrics                             Anesthesia Physical Anesthesia Plan  ASA: 2  Anesthesia Plan:  General   Post-op Pain Management: Tylenol PO (pre-op)*   Induction: Intravenous  PONV Risk Score and Plan: Treatment may vary due to age or medical condition, Midazolam and Ondansetron  Airway Management Planned: Oral ETT  Additional Equipment: None  Intra-op Plan:   Post-operative Plan: Extubation in OR  Informed Consent: I have reviewed the patients History and Physical, chart, labs and discussed the procedure including the risks, benefits and alternatives for the proposed anesthesia with the patient or authorized representative who has indicated his/her understanding and acceptance.     Dental advisory given  Plan Discussed with:   Anesthesia Plan Comments:        Anesthesia Quick Evaluation

## 2022-08-22 NOTE — Plan of Care (Signed)
  Problem: Education: Goal: Knowledge of General Education information will improve Description: Including pain rating scale, medication(s)/side effects and non-pharmacologic comfort measures Outcome: Completed/Met   Problem: Health Behavior/Discharge Planning: Goal: Ability to manage health-related needs will improve Outcome: Completed/Met   Problem: Clinical Measurements: Goal: Respiratory complications will improve Outcome: Completed/Met Goal: Cardiovascular complication will be avoided Outcome: Completed/Met   Problem: Nutrition: Goal: Adequate nutrition will be maintained Outcome: Completed/Met   Problem: Coping: Goal: Level of anxiety will decrease Outcome: Completed/Met

## 2022-08-23 LAB — HEMOGLOBIN AND HEMATOCRIT, BLOOD
HCT: 40.4 % (ref 39.0–52.0)
Hemoglobin: 13.4 g/dL (ref 13.0–17.0)

## 2022-08-23 MED ORDER — LEVOFLOXACIN 500 MG PO TABS: 500.0000 mg | ORAL_TABLET | Freq: Every day | ORAL | 0 refills | Status: AC

## 2022-08-23 NOTE — Progress Notes (Signed)
  Transition of Care [facility]) Screening Note   Patient Details  Name: Timothy Villarreal Date of Birth: Jul 11, 1970   Transition of Care [facility]) CM/SW Contact:    Dessa Phi, RN Phone Number: 08/23/2022, 9:12 AM    Transition of Care Department [facility]) has reviewed patient and no TOC needs have been identified at this time. We will continue to monitor patient advancement through interdisciplinary progression rounds. If new patient transition needs arise, please place a TOC consult.

## 2022-08-23 NOTE — Plan of Care (Signed)
  Problem: Education: Goal: Knowledge of the prescribed therapeutic regimen will improve Outcome: Completed/Met   Problem: Bowel/Gastric: Goal: Gastrointestinal status for postoperative course will improve Outcome: Completed/Met   Problem: Health Behavior/Discharge Planning: Goal: Identification of resources available to assist in meeting health care needs will improve Outcome: Completed/Met   Problem: Skin Integrity: Goal: Demonstration of wound healing without infection will improve Outcome: Completed/Met   Problem: Urinary Elimination: Goal: Ability to avoid or minimize complications of infection will improve Outcome: Completed/Met   Problem: Clinical Measurements: Goal: Ability to maintain clinical measurements within normal limits will improve Outcome: Completed/Met Goal: Will remain free from infection Outcome: Completed/Met Goal: Diagnostic test results will improve Outcome: Completed/Met   Problem: Activity: Goal: Risk for activity intolerance will decrease Outcome: Completed/Met   Problem: Elimination: Goal: Will not experience complications related to bowel motility Outcome: Completed/Met Goal: Will not experience complications related to urinary retention Outcome: Completed/Met   Problem: Pain Managment: Goal: General experience of comfort will improve Outcome: Completed/Met   Problem: Safety: Goal: Ability to remain free from injury will improve Outcome: Completed/Met   Problem: Skin Integrity: Goal: Risk for impaired skin integrity will decrease Outcome: Completed/Met

## 2022-08-23 NOTE — Progress Notes (Signed)
Pt discharged home today per Dr. Garen Lah. Pt's IV site D/C'd and WDL. Pt's VSS. Extra foley catheter supplies given per pt request. Pt provided with home medication list, discharge instructions and prescriptions. Verbalized understanding. Pt ambulated off floor in stable condition accompanied by RN.

## 2022-08-23 NOTE — Discharge Summary (Addendum)
[facility] Discharge Summary  Admit date: 08/22/2022  Discharge date and time: 08/23/22   Discharge to: Home  Discharge Service: Urology  Discharge Attending Physician:  Dr. Gloriann Loan  Discharge  Diagnoses: BPH (benign prostatic hyperplasia)  Secondary Diagnosis: Principal Problem:   BPH (benign prostatic hyperplasia)   OR Procedures: Procedure(s): TRANSURETHRAL WATERJET ABLATION OF PROSTATE 08/22/2022   Ancillary Procedures: None   Discharge Day Services: The patient was seen and examined by the Urology team both in the morning and immediately prior to discharge.  Vital signs and laboratory values were stable and within normal limits.  The physical exam was benign and unchanged and all surgical wounds were examined.  Discharge instructions were explained and all questions answered.  Subjective  No acute events overnight. Pain Controlled. No fever or chills.  Objective Patient Vitals for the past 8 hrs:  BP Temp Temp src Pulse Resp SpO2  08/23/22 0552 122/86 98.2 F (36.8 C) Oral 70 18 98 %  08/23/22 0141 110/75 98.1 F (36.7 C) Oral 76 20 97 %   No intake/output data recorded.  General Appearance:        No acute distress Lungs:                       Normal work of breathing on room air Heart:                                Regular rate and rhythm Abdomen:                         Soft, non-tender, non-distended. Foley draining thin very light pink urine Extremities:                      Warm and well perfused   Hospital Course:  The patient underwent aquablation transurethral prostate resection on 08/22/2022.  The patient tolerated the procedure well, was extubated in the OR, and afterwards was taken to the PACU for routine post-surgical care. When stable the patient was transferred to the floor.   The patient did well postoperatively.  The patient's diet was slowly advanced and at the time of discharge was tolerating a regular diet.  The patient was discharged home 1  Day Post-Op, at which point was tolerating a regular solid diet, have adequate pain control with P.O. pain medication, and could ambulate without difficulty. The patient will follow up with [place] for post op check and catheter removal.  Condition at Discharge: Improved  Discharge Medications:  Allergies as of 08/23/2022   No Known Allergies      Medication List     TAKE these medications    Armour Thyroid 120 MG tablet Generic drug: thyroid Take 120 mg by mouth every morning.   baclofen 10 MG tablet Commonly known as: LIORESAL Take 0.5-1 tablets (5-10 mg total) by mouth at bedtime as needed for muscle spasms.   cephALEXin 500 MG capsule Commonly known as: KEFLEX Take 1 capsule (500 mg total) by mouth 4 (four) times daily.   desvenlafaxine 100 MG 24 hr tablet Commonly known as: PRISTIQ Take 100 mg by mouth daily. Takes this medicine at Night   doxycycline 100 MG capsule Commonly known as: VIBRAMYCIN Take 100 mg by mouth 2 (two) times daily.   HYDROcodone-acetaminophen 5-325 MG tablet Commonly known as: Norco Take 1 tablet by mouth every 4 (four)  hours as needed for moderate pain.   Jornay PM 100 MG Cp24 Generic drug: Methylphenidate HCl ER (PM) Take 100 mg by mouth daily.   levofloxacin 500 MG tablet Commonly known as: Levaquin Take 1 tablet (500 mg total) by mouth daily for 1 day. Please take in the morning prior to your appointment for catheter removal   meloxicam 15 MG tablet Commonly known as: MOBIC Take 1 tablet (15 mg total) by mouth daily.   methylPREDNISolone 4 MG Tbpk tablet Commonly known as: MEDROL DOSEPAK 6 day dose pack - take as directed   nabumetone 500 MG tablet Commonly known as: RELAFEN Take 1 tablet (500 mg total) by mouth 2 (two) times daily as needed.   NON FORMULARY Pt uses a cpap nightly

## 2022-08-24 ENCOUNTER — Emergency Department (HOSPITAL_COMMUNITY): Payer: BC Managed Care – PPO

## 2022-08-24 ENCOUNTER — Other Ambulatory Visit: Payer: Self-pay

## 2022-08-24 ENCOUNTER — Inpatient Hospital Stay (HOSPITAL_COMMUNITY)
Admission: EM | Admit: 2022-08-24 | Discharge: 2022-08-27 | DRG: 854 | Disposition: A | Discharge status: Home / Self Care | Payer: BC Managed Care – PPO | Attending: Internal Medicine | Admitting: Internal Medicine

## 2022-08-24 DIAGNOSIS — M25462 Effusion, left knee: Secondary | ICD-10-CM | POA: Diagnosis present

## 2022-08-24 DIAGNOSIS — N3001 Acute cystitis with hematuria: Secondary | ICD-10-CM

## 2022-08-24 DIAGNOSIS — F32A Depression, unspecified: Secondary | ICD-10-CM | POA: Diagnosis present

## 2022-08-24 DIAGNOSIS — G8929 Other chronic pain: Secondary | ICD-10-CM | POA: Diagnosis present

## 2022-08-24 DIAGNOSIS — R338 Other retention of urine: Secondary | ICD-10-CM | POA: Diagnosis present

## 2022-08-24 DIAGNOSIS — N401 Enlarged prostate with lower urinary tract symptoms: Secondary | ICD-10-CM | POA: Diagnosis present

## 2022-08-24 DIAGNOSIS — Z6832 Body mass index (BMI) 32.0-32.9, adult: Secondary | ICD-10-CM

## 2022-08-24 DIAGNOSIS — E039 Hypothyroidism, unspecified: Secondary | ICD-10-CM | POA: Diagnosis present

## 2022-08-24 DIAGNOSIS — A419 Sepsis, unspecified organism: Principal | ICD-10-CM | POA: Diagnosis present

## 2022-08-24 DIAGNOSIS — G4730 Sleep apnea, unspecified: Secondary | ICD-10-CM | POA: Diagnosis present

## 2022-08-24 DIAGNOSIS — R6510 Systemic inflammatory response syndrome (SIRS) of non-infectious origin without acute organ dysfunction: Principal | ICD-10-CM

## 2022-08-24 DIAGNOSIS — E890 Postprocedural hypothyroidism: Secondary | ICD-10-CM | POA: Diagnosis present

## 2022-08-24 DIAGNOSIS — N390 Urinary tract infection, site not specified: Secondary | ICD-10-CM | POA: Diagnosis present

## 2022-08-24 DIAGNOSIS — Z79899 Other long term (current) drug therapy: Secondary | ICD-10-CM

## 2022-08-24 DIAGNOSIS — M25562 Pain in left knee: Secondary | ICD-10-CM | POA: Diagnosis present

## 2022-08-24 DIAGNOSIS — Z9079 Acquired absence of other genital organ(s): Secondary | ICD-10-CM

## 2022-08-24 DIAGNOSIS — R3129 Other microscopic hematuria: Secondary | ICD-10-CM | POA: Diagnosis present

## 2022-08-24 DIAGNOSIS — F909 Attention-deficit hyperactivity disorder, unspecified type: Secondary | ICD-10-CM | POA: Diagnosis present

## 2022-08-24 DIAGNOSIS — B957 Other staphylococcus as the cause of diseases classified elsewhere: Secondary | ICD-10-CM | POA: Diagnosis present

## 2022-08-24 DIAGNOSIS — M25561 Pain in right knee: Secondary | ICD-10-CM | POA: Diagnosis present

## 2022-08-24 DIAGNOSIS — N3289 Other specified disorders of bladder: Secondary | ICD-10-CM | POA: Diagnosis present

## 2022-08-24 DIAGNOSIS — E669 Obesity, unspecified: Secondary | ICD-10-CM | POA: Diagnosis present

## 2022-08-24 DIAGNOSIS — Z825 Family history of asthma and other chronic lower respiratory diseases: Secondary | ICD-10-CM

## 2022-08-24 DIAGNOSIS — M109 Gout, unspecified: Secondary | ICD-10-CM | POA: Diagnosis present

## 2022-08-24 DIAGNOSIS — Z1152 Encounter for screening for COVID-19: Secondary | ICD-10-CM

## 2022-08-24 DIAGNOSIS — Z85850 Personal history of malignant neoplasm of thyroid: Secondary | ICD-10-CM

## 2022-08-24 DIAGNOSIS — G4733 Obstructive sleep apnea (adult) (pediatric): Secondary | ICD-10-CM | POA: Diagnosis present

## 2022-08-24 DIAGNOSIS — N179 Acute kidney failure, unspecified: Secondary | ICD-10-CM | POA: Diagnosis present

## 2022-08-24 DIAGNOSIS — M1120 Other chondrocalcinosis, unspecified site: Secondary | ICD-10-CM | POA: Diagnosis present

## 2022-08-24 DIAGNOSIS — F419 Anxiety disorder, unspecified: Secondary | ICD-10-CM | POA: Diagnosis present

## 2022-08-24 DIAGNOSIS — K769 Liver disease, unspecified: Secondary | ICD-10-CM | POA: Diagnosis present

## 2022-08-24 DIAGNOSIS — N529 Male erectile dysfunction, unspecified: Secondary | ICD-10-CM | POA: Diagnosis present

## 2022-08-24 LAB — CBC WITH DIFFERENTIAL/PLATELET
Abs Immature Granulocytes: 0.06 10*3/uL (ref 0.00–0.07)
Basophils Absolute: 0.1 10*3/uL (ref 0.0–0.1)
Basophils Relative: 1 %
Eosinophils Absolute: 0.1 10*3/uL (ref 0.0–0.5)
Eosinophils Relative: 1 %
HCT: 41.3 % (ref 39.0–52.0)
Hemoglobin: 13.8 g/dL (ref 13.0–17.0)
Immature Granulocytes: 1 %
Lymphocytes Relative: 11 %
Lymphs Abs: 1.4 10*3/uL (ref 0.7–4.0)
MCH: 28.9 pg (ref 26.0–34.0)
MCHC: 33.4 g/dL (ref 30.0–36.0)
MCV: 86.4 fL (ref 80.0–100.0)
Monocytes Absolute: 1.4 10*3/uL — ABNORMAL HIGH (ref 0.1–1.0)
Monocytes Relative: 11 %
Neutro Abs: 10.2 10*3/uL — ABNORMAL HIGH (ref 1.7–7.7)
Neutrophils Relative %: 75 %
Platelets: 234 10*3/uL (ref 150–400)
RBC: 4.78 MIL/uL (ref 4.22–5.81)
RDW: 13.2 % (ref 11.5–15.5)
WBC: 13.2 10*3/uL — ABNORMAL HIGH (ref 4.0–10.5)
nRBC: 0 % (ref 0.0–0.2)

## 2022-08-24 LAB — URINALYSIS, ROUTINE W REFLEX MICROSCOPIC
Bilirubin Urine: NEGATIVE
Glucose, UA: NEGATIVE mg/dL
Ketones, ur: NEGATIVE mg/dL
Nitrite: NEGATIVE
Protein, ur: 100 mg/dL — AB
RBC / HPF: >50 RBC/hpf (ref 0–5)
Specific Gravity, Urine: 1.023 (ref 1.005–1.030)
WBC, UA: >50 WBC/hpf (ref 0–5)
pH: 6 (ref 5.0–8.0)

## 2022-08-24 LAB — COMPREHENSIVE METABOLIC PANEL
ALT: 13 U/L (ref 0–44)
AST: 13 U/L — ABNORMAL LOW (ref 15–41)
Albumin: 3.7 g/dL (ref 3.5–5.0)
Alkaline Phosphatase: 63 U/L (ref 38–126)
Anion gap: 10 (ref 5–15)
BUN: 24 mg/dL — ABNORMAL HIGH (ref 6–20)
CO2: 24 mmol/L (ref 22–32)
Calcium: 8.4 mg/dL — ABNORMAL LOW (ref 8.9–10.3)
Chloride: 103 mmol/L (ref 98–111)
Creatinine, Ser: 1.52 mg/dL — ABNORMAL HIGH (ref 0.61–1.24)
GFR, Estimated: 54 mL/min — ABNORMAL LOW (ref >60)
Glucose, Bld: 120 mg/dL — ABNORMAL HIGH (ref 70–99)
Potassium: 3.5 mmol/L (ref 3.5–5.1)
Sodium: 137 mmol/L (ref 135–145)
Total Bilirubin: 0.8 mg/dL (ref 0.3–1.2)
Total Protein: 6.1 g/dL — ABNORMAL LOW (ref 6.5–8.1)

## 2022-08-24 LAB — RESP PANEL BY RT-PCR (RSV, FLU A&B, COVID)  RVPGX2
Influenza A by PCR: NEGATIVE
Influenza B by PCR: NEGATIVE
Resp Syncytial Virus by PCR: NEGATIVE
SARS Coronavirus 2 by RT PCR: NEGATIVE

## 2022-08-24 LAB — LACTIC ACID, PLASMA: Lactic Acid, Venous: 1.3 mmol/L (ref 0.5–1.9)

## 2022-08-24 LAB — TROPONIN I (HIGH SENSITIVITY): Troponin I (High Sensitivity): 3 ng/L (ref <18)

## 2022-08-24 MED ORDER — ACETAMINOPHEN 325 MG PO TABS
650.0000 mg | ORAL_TABLET | Freq: Once | ORAL | Status: AC
  Administered 2022-08-24: 650 mg via ORAL
  Filled 2022-08-24: qty 2

## 2022-08-24 MED ORDER — SODIUM CHLORIDE 0.9 % IV BOLUS
1000.0000 mL | Freq: Once | INTRAVENOUS | Status: AC
  Administered 2022-08-24: 1000 mL via INTRAVENOUS

## 2022-08-24 MED ORDER — SODIUM CHLORIDE 0.9 % IV SOLN
1.0000 g | Freq: Once | INTRAVENOUS | Status: AC
  Administered 2022-08-24: 1 g via INTRAVENOUS
  Filled 2022-08-24: qty 10

## 2022-08-24 MED ORDER — IOHEXOL 300 MG/ML  SOLN
100.0000 mL | Freq: Once | INTRAMUSCULAR | Status: AC | PRN
  Administered 2022-08-24: 100 mL via INTRAVENOUS

## 2022-08-24 NOTE — ED Triage Notes (Signed)
Pt arrives via POV concerns for fevers, lower back pain, generalized weakness, and increased bilateral leg swelling. Per note pt was recently hospitalized following ablation of prostates with complication of UTI. Started new abx today.

## 2022-08-24 NOTE — ED Provider Notes (Signed)
[place] AT [facility] Provider Note   CSN: [id number] Arrival date & time: 08/24/22  2021     History {Add pertinent medical, surgical, social history, OB history to HPI:1} Chief Complaint  Patient presents with   Back Pain   Fever    Timothy Villarreal is a 52 y.o. male.  52 year old male brought in by wife with concern for fever onset today with lower extremity swelling, pain in the knees, back pain. Prostatitis in December, treated with abx, then catheter leading up to blood clots in catheter 1 week ago. Had transurethral prostate resection of the prostate on 08/22/22, dc home yesterday. Now with fever, bilateral leg swelling and knee pain, low back pain.        Home Medications Prior to Admission medications   Medication Sig Start Date End Date Taking? Authorizing Provider  ALEVE 220 MG tablet Take 220-440 mg by mouth 2 (two) times daily as needed (for mild pain or headaches).   Yes [provider]  ARMOUR THYROID 120 MG tablet Take 120 mg by mouth daily before breakfast. 05/17/22  Yes [provider]  ciprofloxacin (CIPRO) 500 MG tablet Take 500 mg by mouth 2 (two) times daily. 08/18/22 08/25/22 Yes [provider]  desvenlafaxine (PRISTIQ) 100 MG 24 hr tablet Take 100 mg by mouth at bedtime. 04/06/22  Yes [provider]  HYDROcodone-acetaminophen (NORCO) 5-325 MG tablet Take 1 tablet by mouth every 4 (four) hours as needed for moderate pain. 08/22/22  Yes Marton Redwood III, MD  JORNAY PM 100 MG CP24 Take 100 mg by mouth at bedtime. 06/07/22  Yes [provider]  NON FORMULARY Take 1-2 tablets by mouth See admin instructions. CBD gummies- Chew 1-2 gummies by mouth at bedtime as needed for sleep   Yes [provider]  PRESCRIPTION MEDICATION CPAP- At bedtime   Yes [provider]  tamsulosin (FLOMAX) 0.4 MG CAPS capsule Take 0.4 mg by mouth daily after breakfast.   Yes [provider]  baclofen (LIORESAL) 10 MG tablet Take 0.5-1 tablets (5-10 mg total) by mouth at bedtime as needed for muscle spasms. Patient not taking: Reported on 08/05/2022 02/27/20   Hilts, Legrand Como, MD  levofloxacin (LEVAQUIN) 500 MG tablet Take 1 tablet (500 mg total) by mouth daily for 1 day. Please take in the morning prior to your appointment for catheter removal Patient not taking: Reported on 08/24/2022 08/23/22 08/24/22  Florentina Addison, MD  meloxicam (MOBIC) 15 MG tablet Take 1 tablet (15 mg total) by mouth daily. Patient not taking: Reported on 08/05/2022 04/27/22   Edrick Kins, DPM  methylPREDNISolone (MEDROL DOSEPAK) 4 MG TBPK tablet 6 day dose pack - take as directed Patient not taking: Reported on 08/05/2022 04/27/22   Edrick Kins, DPM  nabumetone (RELAFEN) 500 MG tablet Take 1 tablet (500 mg total) by mouth 2 (two) times daily as needed. Patient not taking: Reported on 08/05/2022 02/27/20   Hilts, Legrand Como, MD      Allergies    Patient has no known allergies.    Review of Systems   Review of Systems Negative except as per HPI Physical Exam Updated Vital Signs BP (!) 143/85   Pulse 84   Temp (!) 101.7 F (38.7 C) (Oral)   Resp (!) 22   SpO2 96%  Physical Exam Vitals and nursing note reviewed.  Constitutional:      General: He is not in acute distress.  Appearance: He is well-developed. He is not diaphoretic.  HENT:     Head: Normocephalic and atraumatic.     Mouth/Throat:     Mouth: Mucous membranes are moist.  Eyes:     Conjunctiva/sclera: Conjunctivae normal.  Cardiovascular:     Rate and Rhythm: Normal rate and regular rhythm.     Heart sounds: Normal heart sounds.  Pulmonary:     Effort: Pulmonary effort is normal.     Breath sounds: Normal breath sounds.  Abdominal:     Palpations: Abdomen is soft.     Tenderness: There is no abdominal tenderness.  Musculoskeletal:     Cervical back: Neck supple.     Comments: Non pitting lower extremity swelling, no  calf tenderness   Skin:    General: Skin is warm and dry.     Findings: No erythema or rash.  Neurological:     Mental Status: He is alert and oriented to person, place, and time.  Psychiatric:        Behavior: Behavior normal.     ED Results / Procedures / Treatments   Labs (all labs ordered are listed, but only abnormal results are displayed) Labs Reviewed  CBC WITH DIFFERENTIAL/PLATELET - Abnormal; Notable for the following components:      Result Value   WBC 13.2 (*)    Neutro Abs 10.2 (*)    Monocytes Absolute 1.4 (*)    All other components within normal limits  COMPREHENSIVE METABOLIC PANEL - Abnormal; Notable for the following components:   Glucose, Bld 120 (*)    BUN 24 (*)    Creatinine, Ser 1.52 (*)    Calcium 8.4 (*)    Total Protein 6.1 (*)    AST 13 (*)    GFR, Estimated 54 (*)    All other components within normal limits  RESP PANEL BY RT-PCR (RSV, FLU A&B, COVID)  RVPGX2  CULTURE, BLOOD (ROUTINE X 2)  CULTURE, BLOOD (ROUTINE X 2)  LACTIC ACID, PLASMA  URINALYSIS, ROUTINE W REFLEX MICROSCOPIC  TROPONIN I (HIGH SENSITIVITY)  TROPONIN I (HIGH SENSITIVITY)    EKG None  Radiology DG Chest Port 1 View  Result Date: 08/24/2022 CLINICAL DATA:  Fever, leg swelling, and weakness. EXAM: PORTABLE CHEST 1 VIEW COMPARISON:  09/18/2019 FINDINGS: The heart size and mediastinal contours are within normal limits. Both lungs are clear. The visualized skeletal structures are unremarkable. IMPRESSION: No active disease. Electronically Signed   By: Lucienne Capers M.D.   On: 08/24/2022 21:50    Procedures Procedures  {Document cardiac monitor, telemetry assessment procedure when appropriate:1}  Medications Ordered in ED Medications  iohexol (OMNIPAQUE) 300 MG/ML solution 100 mL (has no administration in time range)  cefTRIAXone (ROCEPHIN) 1 g in sodium chloride 0.9 % 100 mL IVPB (has no administration in time range)  acetaminophen (TYLENOL) tablet 650 mg (650 mg Oral  Given 08/24/22 2300)  sodium chloride 0.9 % bolus 1,000 mL (1,000 mLs Intravenous Bolus 08/24/22 2300)    ED Course/ Medical Decision Making/ A&P   {   Click here for ABCD2, HEART and other calculatorsREFRESH Note before signing :1}                          Medical Decision Making Amount and/or Complexity of Data Reviewed Radiology: ordered.  Risk OTC drugs. Prescription drug management.   This patient presents to the ED for concern of ***, this involves an extensive number of treatment options, and  is a complaint that carries with it a high risk of complications and morbidity.  The differential diagnosis includes ***   Co morbidities that complicate the patient evaluation  ***   Additional history obtained:  Additional history obtained from *** External records from outside source obtained and reviewed including ***   Lab Tests:  I Ordered, and personally interpreted labs.  The pertinent results include:  ***   Imaging Studies ordered:  I ordered imaging studies including ***  I independently visualized and interpreted imaging which showed *** I agree with the radiologist interpretation   Cardiac Monitoring: / EKG:  The patient was maintained on a cardiac monitor.  I personally viewed and interpreted the cardiac monitored which showed an underlying rhythm of: ***   Consultations Obtained:  I requested consultation with the ***,  and discussed lab and imaging findings as well as pertinent plan - they recommend: ***   Problem List / ED Course / Critical interventions / Medication management  *** I ordered medication including ***  for ***  Reevaluation of the patient after these medicines showed that the patient {resolved/improved/worsened:23923::"improved"} I have reviewed the patients home medicines and have made adjustments as needed   Social Determinants of Health:  ***   Test / Admission - Considered:  ***   {Document critical care time when  appropriate:1} {Document review of labs and clinical decision tools ie heart score, Chads2Vasc2 etc:1}  {Document your independent review of radiology images, and any outside records:1} {Document your discussion with family members, caretakers, and with consultants:1} {Document social determinants of health affecting pt's care:1} {Document your decision making why or why not admission, treatments were needed:1} Final Clinical Impression(s) / ED Diagnoses Final diagnoses:  None    Rx / DC Orders ED Discharge Orders     None

## 2022-08-25 ENCOUNTER — Encounter (HOSPITAL_COMMUNITY): Payer: Self-pay | Admitting: Family Medicine

## 2022-08-25 ENCOUNTER — Inpatient Hospital Stay (HOSPITAL_COMMUNITY): Payer: BC Managed Care – PPO

## 2022-08-25 DIAGNOSIS — N390 Urinary tract infection, site not specified: Secondary | ICD-10-CM | POA: Diagnosis not present

## 2022-08-25 DIAGNOSIS — F32A Depression, unspecified: Secondary | ICD-10-CM

## 2022-08-25 DIAGNOSIS — M25562 Pain in left knee: Secondary | ICD-10-CM | POA: Diagnosis present

## 2022-08-25 DIAGNOSIS — N3289 Other specified disorders of bladder: Secondary | ICD-10-CM | POA: Diagnosis present

## 2022-08-25 DIAGNOSIS — N401 Enlarged prostate with lower urinary tract symptoms: Secondary | ICD-10-CM | POA: Diagnosis present

## 2022-08-25 DIAGNOSIS — N529 Male erectile dysfunction, unspecified: Secondary | ICD-10-CM | POA: Diagnosis present

## 2022-08-25 DIAGNOSIS — K769 Liver disease, unspecified: Secondary | ICD-10-CM | POA: Diagnosis present

## 2022-08-25 DIAGNOSIS — R338 Other retention of urine: Secondary | ICD-10-CM | POA: Diagnosis present

## 2022-08-25 DIAGNOSIS — E039 Hypothyroidism, unspecified: Secondary | ICD-10-CM | POA: Diagnosis present

## 2022-08-25 DIAGNOSIS — N179 Acute kidney failure, unspecified: Secondary | ICD-10-CM | POA: Diagnosis present

## 2022-08-25 DIAGNOSIS — M25462 Effusion, left knee: Secondary | ICD-10-CM | POA: Diagnosis present

## 2022-08-25 DIAGNOSIS — E890 Postprocedural hypothyroidism: Secondary | ICD-10-CM | POA: Diagnosis present

## 2022-08-25 DIAGNOSIS — A419 Sepsis, unspecified organism: Secondary | ICD-10-CM | POA: Diagnosis present

## 2022-08-25 DIAGNOSIS — G8929 Other chronic pain: Secondary | ICD-10-CM | POA: Diagnosis present

## 2022-08-25 DIAGNOSIS — F909 Attention-deficit hyperactivity disorder, unspecified type: Secondary | ICD-10-CM | POA: Diagnosis present

## 2022-08-25 DIAGNOSIS — G4730 Sleep apnea, unspecified: Secondary | ICD-10-CM

## 2022-08-25 DIAGNOSIS — Z1152 Encounter for screening for COVID-19: Secondary | ICD-10-CM | POA: Diagnosis not present

## 2022-08-25 DIAGNOSIS — M25561 Pain in right knee: Secondary | ICD-10-CM

## 2022-08-25 DIAGNOSIS — B957 Other staphylococcus as the cause of diseases classified elsewhere: Secondary | ICD-10-CM | POA: Diagnosis present

## 2022-08-25 DIAGNOSIS — Z9079 Acquired absence of other genital organ(s): Secondary | ICD-10-CM | POA: Diagnosis not present

## 2022-08-25 DIAGNOSIS — M109 Gout, unspecified: Secondary | ICD-10-CM | POA: Diagnosis present

## 2022-08-25 DIAGNOSIS — Z825 Family history of asthma and other chronic lower respiratory diseases: Secondary | ICD-10-CM | POA: Diagnosis not present

## 2022-08-25 DIAGNOSIS — G4733 Obstructive sleep apnea (adult) (pediatric): Secondary | ICD-10-CM | POA: Diagnosis present

## 2022-08-25 DIAGNOSIS — E669 Obesity, unspecified: Secondary | ICD-10-CM | POA: Diagnosis present

## 2022-08-25 DIAGNOSIS — Z85850 Personal history of malignant neoplasm of thyroid: Secondary | ICD-10-CM | POA: Diagnosis not present

## 2022-08-25 DIAGNOSIS — F419 Anxiety disorder, unspecified: Secondary | ICD-10-CM | POA: Diagnosis present

## 2022-08-25 HISTORY — DX: Urinary tract infection, site not specified: A41.9

## 2022-08-25 LAB — CBC
HCT: 37.9 % — ABNORMAL LOW (ref 39.0–52.0)
Hemoglobin: 12.7 g/dL — ABNORMAL LOW (ref 13.0–17.0)
MCH: 28.9 pg (ref 26.0–34.0)
MCHC: 33.5 g/dL (ref 30.0–36.0)
MCV: 86.3 fL (ref 80.0–100.0)
Platelets: 212 10*3/uL (ref 150–400)
RBC: 4.39 MIL/uL (ref 4.22–5.81)
RDW: 13.3 % (ref 11.5–15.5)
WBC: 12.2 10*3/uL — ABNORMAL HIGH (ref 4.0–10.5)
nRBC: 0 % (ref 0.0–0.2)

## 2022-08-25 LAB — SYNOVIAL CELL COUNT + DIFF, W/ CRYSTALS
Crystals, Fluid: NONE SEEN
Eosinophils-Synovial: 0 % (ref 0–1)
Lymphocytes-Synovial Fld: 9 % (ref 0–20)
Monocyte-Macrophage-Synovial Fluid: 30 % — ABNORMAL LOW (ref 50–90)
Neutrophil, Synovial: 61 % — ABNORMAL HIGH (ref 0–25)
WBC, Synovial: 2640 /mm3 — ABNORMAL HIGH (ref 0–200)

## 2022-08-25 LAB — PROTIME-INR
INR: 1 (ref 0.8–1.2)
Prothrombin Time: 13.5 seconds (ref 11.4–15.2)

## 2022-08-25 LAB — BASIC METABOLIC PANEL
Anion gap: 8 (ref 5–15)
BUN: 20 mg/dL (ref 6–20)
CO2: 24 mmol/L (ref 22–32)
Calcium: 7.8 mg/dL — ABNORMAL LOW (ref 8.9–10.3)
Chloride: 103 mmol/L (ref 98–111)
Creatinine, Ser: 1.21 mg/dL (ref 0.61–1.24)
GFR, Estimated: >60 mL/min (ref >60)
Glucose, Bld: 116 mg/dL — ABNORMAL HIGH (ref 70–99)
Potassium: 3.7 mmol/L (ref 3.5–5.1)
Sodium: 135 mmol/L (ref 135–145)

## 2022-08-25 LAB — MAGNESIUM: Magnesium: 1.9 mg/dL (ref 1.7–2.4)

## 2022-08-25 LAB — PHOSPHORUS: Phosphorus: 4.7 mg/dL — ABNORMAL HIGH (ref 2.5–4.6)

## 2022-08-25 LAB — APTT: aPTT: 33 seconds (ref 24–36)

## 2022-08-25 LAB — HIV ANTIBODY (ROUTINE TESTING W REFLEX): HIV Screen 4th Generation wRfx: NONREACTIVE

## 2022-08-25 MED ORDER — SODIUM CHLORIDE 0.9 % IV SOLN: 2.0000 g | Freq: Once | INTRAVENOUS | Status: DC

## 2022-08-25 MED ORDER — SODIUM CHLORIDE 0.9 % IV SOLN
2.0000 g | Freq: Three times a day (TID) | INTRAVENOUS | Status: DC
  Administered 2022-08-25 – 2022-08-26 (×4): 2 g via INTRAVENOUS
  Filled 2022-08-25 (×5): qty 12.5

## 2022-08-25 MED ORDER — ONDANSETRON HCL 4 MG/2ML IJ SOLN
4.0000 mg | Freq: Four times a day (QID) | INTRAMUSCULAR | Status: DC | PRN
  Administered 2022-08-27: 4 mg via INTRAVENOUS
  Filled 2022-08-25: qty 2

## 2022-08-25 MED ORDER — DICLOFENAC SODIUM 1 % EX GEL
2.0000 g | Freq: Four times a day (QID) | CUTANEOUS | Status: DC
  Administered 2022-08-25 – 2022-08-26 (×3): 2 g via TOPICAL
  Filled 2022-08-25: qty 100

## 2022-08-25 MED ORDER — MORPHINE SULFATE (PF) 4 MG/ML IV SOLN
4.0000 mg | Freq: Once | INTRAVENOUS | Status: AC
  Administered 2022-08-25: 4 mg via INTRAVENOUS
  Filled 2022-08-25: qty 1

## 2022-08-25 MED ORDER — TAMSULOSIN HCL 0.4 MG PO CAPS
0.4000 mg | ORAL_CAPSULE | Freq: Every day | ORAL | Status: DC
  Administered 2022-08-25 – 2022-08-27 (×3): 0.4 mg via ORAL
  Filled 2022-08-25 (×3): qty 1

## 2022-08-25 MED ORDER — ACETAMINOPHEN 650 MG RE SUPP: 650.0000 mg | Freq: Four times a day (QID) | RECTAL | Status: DC | PRN

## 2022-08-25 MED ORDER — HYDROMORPHONE HCL 1 MG/ML IJ SOLN
0.5000 mg | INTRAMUSCULAR | Status: DC | PRN
  Administered 2022-08-25 (×3): 1 mg via INTRAVENOUS
  Filled 2022-08-25 (×3): qty 1

## 2022-08-25 MED ORDER — LACTATED RINGERS IV SOLN: INTRAVENOUS | Status: AC

## 2022-08-25 MED ORDER — KETOROLAC TROMETHAMINE 30 MG/ML IJ SOLN
30.0000 mg | Freq: Once | INTRAMUSCULAR | Status: AC
  Administered 2022-08-25: 30 mg via INTRAVENOUS
  Filled 2022-08-25: qty 1

## 2022-08-25 MED ORDER — OXYCODONE HCL 5 MG PO TABS
5.0000 mg | ORAL_TABLET | ORAL | Status: DC | PRN
  Administered 2022-08-25 (×3): 5 mg via ORAL
  Filled 2022-08-25 (×4): qty 1

## 2022-08-25 MED ORDER — ONDANSETRON HCL 4 MG PO TABS: 4.0000 mg | ORAL_TABLET | Freq: Four times a day (QID) | ORAL | Status: DC | PRN

## 2022-08-25 MED ORDER — ACETAMINOPHEN 325 MG PO TABS
650.0000 mg | ORAL_TABLET | Freq: Four times a day (QID) | ORAL | Status: DC | PRN
  Administered 2022-08-25 – 2022-08-26 (×3): 650 mg via ORAL
  Filled 2022-08-25 (×3): qty 2

## 2022-08-25 MED ORDER — LACTATED RINGERS IV SOLN: INTRAVENOUS | Status: DC

## 2022-08-25 MED ORDER — POLYETHYLENE GLYCOL 3350 17 G PO PACK
17.0000 g | PACK | Freq: Every day | ORAL | Status: DC | PRN
  Administered 2022-08-26: 17 g via ORAL
  Filled 2022-08-25: qty 1

## 2022-08-25 MED ORDER — ENOXAPARIN SODIUM 40 MG/0.4ML IJ SOSY
40.0000 mg | PREFILLED_SYRINGE | INTRAMUSCULAR | Status: DC
  Administered 2022-08-25 – 2022-08-27 (×3): 40 mg via SUBCUTANEOUS
  Filled 2022-08-25 (×3): qty 0.4

## 2022-08-25 MED ORDER — CHLORHEXIDINE GLUCONATE CLOTH 2 % EX PADS
6.0000 | MEDICATED_PAD | Freq: Every day | CUTANEOUS | Status: DC
  Administered 2022-08-26: 6 via TOPICAL

## 2022-08-25 MED ORDER — ONDANSETRON HCL 4 MG/2ML IJ SOLN
4.0000 mg | Freq: Once | INTRAMUSCULAR | Status: AC
  Administered 2022-08-25: 4 mg via INTRAVENOUS
  Filled 2022-08-25: qty 2

## 2022-08-25 MED ORDER — MELATONIN 3 MG PO TABS
6.0000 mg | ORAL_TABLET | Freq: Every evening | ORAL | Status: DC | PRN
  Administered 2022-08-25 – 2022-08-26 (×2): 6 mg via ORAL
  Filled 2022-08-25 (×2): qty 2

## 2022-08-25 MED ORDER — VENLAFAXINE HCL ER 150 MG PO CP24
150.0000 mg | ORAL_CAPSULE | Freq: Every day | ORAL | Status: DC
  Administered 2022-08-25 – 2022-08-26 (×2): 150 mg via ORAL
  Filled 2022-08-25 (×2): qty 1

## 2022-08-25 MED ORDER — THYROID 60 MG PO TABS
120.0000 mg | ORAL_TABLET | Freq: Every day | ORAL | Status: DC
  Administered 2022-08-25 – 2022-08-27 (×3): 120 mg via ORAL
  Filled 2022-08-25 (×3): qty 2

## 2022-08-25 NOTE — ED Notes (Signed)
ED TO INPATIENT HANDOFF REPORT  ED Nurse Name and Phone #: Tonette Bihari [phone]  S Name/Age/Gender Timothy Villarreal 52 y.o. male Room/Bed: WA15/WA15  Code Status   Code Status: Full Code  Home/SNF/Other Home Patient oriented to: self, place, time, and situation Is this baseline? Yes   Triage Complete: Triage complete  Chief Complaint Sepsis secondary to UTI ([place]) [A41.9, N39.0]  Triage Note Pt arrives via POV concerns for fevers, lower back pain, generalized weakness, and increased bilateral leg swelling. Per note pt was recently hospitalized following ablation of prostates with complication of UTI. Started new abx today.    Allergies No Known Allergies  Level of Care/Admitting Diagnosis ED Disposition     ED Disposition  Admit   Condition  --   Comment  Hospital Area: [facility] [[id number]]  Level of Care: Med-Surg [16]  May admit patient to [facility] or [facility] if equivalent level of care is available:: Yes  Covid Evaluation: Confirmed COVID Negative  Diagnosis: [id number]  Admitting Physician: Vianne Bulls [id number]  Attending Physician: Vianne Bulls [id number]  Certification:: I certify this patient will need inpatient services for at least 2 midnights  Estimated Length of Stay: 3          B Medical/Surgery History Past Medical History:  Diagnosis Date   ADHD (attention deficit hyperactivity disorder)    Anxiety    Arthritis    Cancer ([place])    thyroid cancer   Complication of anesthesia    nausea / vomit (over 25 yrs ago)   Hypothyroidism    Hx thyroidectomy  2007   Sleep apnea    Past Surgical History:  Procedure Laterality Date   EYE SURGERY     Lasik surgery   THYROIDECTOMY  2007     A IV Location/Drains/Wounds Patient Lines/Drains/Airways Status     Active Line/Drains/Airways     Name Placement date Placement time Site Days   Peripheral IV 08/24/22 Anterior;Left Forearm 08/24/22   2159  Forearm  1   Urethral Catheter Dr. Gloriann Loan Latex 08/22/22  1253  Latex  3            Intake/Output Last 24 hours No intake or output data in the 24 hours ending 08/25/22 0150  Labs/Imaging Results for orders placed or performed during the hospital encounter of 08/24/22 (from the past 48 hour(s))  Urinalysis, Routine w reflex microscopic -Urine, Clean Catch     Status: Abnormal   Collection Time: 08/24/22  9:30 PM  Result Value Ref Range   Color, Urine YELLOW YELLOW   APPearance HAZY (A) CLEAR   Specific Gravity, Urine 1.023 1.005 - 1.030   pH 6.0 5.0 - 8.0   Glucose, UA NEGATIVE NEGATIVE mg/dL   Hgb urine dipstick LARGE (A) NEGATIVE   Bilirubin Urine NEGATIVE NEGATIVE   Ketones, ur NEGATIVE NEGATIVE mg/dL   Protein, ur 100 (A) NEGATIVE mg/dL   Nitrite NEGATIVE NEGATIVE   Leukocytes,Ua LARGE (A) NEGATIVE   RBC / HPF >50 0 - 5 RBC/hpf   WBC, UA >50 0 - 5 WBC/hpf   Bacteria, UA RARE (A) NONE SEEN   Squamous Epithelial / HPF 0-5 0 - 5 /HPF   Mucus PRESENT     Comment: Performed at [facility], [place] [address], [place],  [address]  CBC with Differential     Status: Abnormal   Collection Time: 08/24/22  9:55 PM  Result Value Ref Range  WBC 13.2 (H) 4.0 - 10.5 K/uL   RBC 4.78 4.22 - 5.81 MIL/uL   Hemoglobin 13.8 13.0 - 17.0 g/dL   HCT 41.3 39.0 - 52.0 %   MCV 86.4 80.0 - 100.0 fL   MCH 28.9 26.0 - 34.0 pg   MCHC 33.4 30.0 - 36.0 g/dL   RDW 13.2 11.5 - 15.5 %   Platelets 234 150 - 400 K/uL   nRBC 0.0 0.0 - 0.2 %   Neutrophils Relative % 75 %   Neutro Abs 10.2 (H) 1.7 - 7.7 K/uL   Lymphocytes Relative 11 %   Lymphs Abs 1.4 0.7 - 4.0 K/uL   Monocytes Relative 11 %   Monocytes Absolute 1.4 (H) 0.1 - 1.0 K/uL   Eosinophils Relative 1 %   Eosinophils Absolute 0.1 0.0 - 0.5 K/uL   Basophils Relative 1 %   Basophils Absolute 0.1 0.0 - 0.1 K/uL   Immature Granulocytes 1 %   Abs Immature Granulocytes 0.06 0.00 - 0.07 K/uL    Comment: Performed  at [facility], [place] [address], [place], [place] [address]  Lactic acid, plasma     Status: None   Collection Time: 08/24/22  9:55 PM  Result Value Ref Range   Lactic Acid, Venous 1.3 0.5 - 1.9 mmol/L    Comment: Performed at [facility], [place] [address], [place], [place] [address]  Troponin I (High Sensitivity)     Status: None   Collection Time: 08/24/22  9:55 PM  Result Value Ref Range   Troponin I (High Sensitivity) 3 <18 ng/L    Comment: (NOTE) Elevated high sensitivity troponin I (hsTnI) values and significant  changes across serial measurements may suggest ACS but many other  chronic and acute conditions are known to elevate hsTnI results.  Refer to the "Links" section for chest pain algorithms and additional  guidance. Performed at [facility], [place] [address] [place], [place] [address]   Comprehensive metabolic panel     Status: Abnormal   Collection Time: 08/24/22  9:55 PM  Result Value Ref Range   Sodium 137 135 - 145 mmol/L   Potassium 3.5 3.5 - 5.1 mmol/L   Chloride 103 98 - 111 mmol/L   CO2 24 22 - 32 mmol/L   Glucose, Bld 120 (H) 70 - 99 mg/dL    Comment: Glucose reference range applies only to samples taken after fasting for at least 8 hours.   BUN 24 (H) 6 - 20 mg/dL   Creatinine, Ser 1.52 (H) 0.61 - 1.24 mg/dL   Calcium 8.4 (L) 8.9 - 10.3 mg/dL   Total Protein 6.1 (L) 6.5 - 8.1 g/dL   Albumin 3.7 3.5 - 5.0 g/dL   AST 13 (L) 15 - 41 U/L   ALT 13 0 - 44 U/L   Alkaline Phosphatase 63 38 - 126 U/L   Total Bilirubin 0.8 0.3 - 1.2 mg/dL   GFR, Estimated 54 (L) >60 mL/min    Comment: (NOTE) Calculated using the CKD-EPI Creatinine Equation (2021)    Anion gap 10 5 - 15    Comment: Performed at [facility], [place] [address] [place], [place] [address]  Resp panel by RT-PCR (RSV, Flu A&B, Covid)     Status: None   Collection Time: 08/24/22  9:55 PM   Specimen: Nasal Swab  Result Value  Ref Range   SARS Coronavirus 2 by RT PCR NEGATIVE NEGATIVE    Comment: (NOTE) SARS-CoV-2 target  nucleic acids are NOT DETECTED.  The SARS-CoV-2 RNA is generally detectable in upper respiratory specimens during the acute phase of infection. The lowest concentration of SARS-CoV-2 viral copies this assay can detect is 138 copies/mL. A negative result does not preclude SARS-Cov-2 infection and should not be used as the sole basis for treatment or other patient management decisions. A negative result may occur with  improper specimen collection/handling, submission of specimen other than nasopharyngeal swab, presence of viral mutation(s) within the areas targeted by this assay, and inadequate number of viral copies(<138 copies/mL). A negative result must be combined with clinical observations, patient history, and epidemiological information. The expected result is Negative.  Fact Sheet for Patients:  EntrepreneurPulse.com.au  Fact Sheet for Healthcare Providers:  IncredibleEmployment.be  This test is no t yet approved or cleared by the [place] FDA and  has been authorized for detection and/or diagnosis of SARS-CoV-2 by FDA under an Emergency Use Authorization (EUA). This EUA will remain  in effect (meaning this test can be used) for the duration of the COVID-19 declaration under Section 564(b)(1) of the Act, 21 U.S.C.section 360bbb-3(b)(1), unless the authorization is terminated  or revoked sooner.       Influenza A by PCR NEGATIVE NEGATIVE   Influenza B by PCR NEGATIVE NEGATIVE    Comment: (NOTE) The Xpert Xpress SARS-CoV-2/FLU/RSV plus assay is intended as an aid in the diagnosis of influenza from Nasopharyngeal swab specimens and should not be used as a sole basis for treatment. Nasal washings and aspirates are unacceptable for Xpert Xpress SARS-CoV-2/FLU/RSV testing.  Fact Sheet for  Patients: EntrepreneurPulse.com.au  Fact Sheet for Healthcare Providers: IncredibleEmployment.be  This test is not yet approved or cleared by the [place] FDA and has been authorized for detection and/or diagnosis of SARS-CoV-2 by FDA under an Emergency Use Authorization (EUA). This EUA will remain in effect (meaning this test can be used) for the duration of the COVID-19 declaration under Section 564(b)(1) of the Act, 21 U.S.C. section 360bbb-3(b)(1), unless the authorization is terminated or revoked.     Resp Syncytial Virus by PCR NEGATIVE NEGATIVE    Comment: (NOTE) Fact Sheet for Patients: EntrepreneurPulse.com.au  Fact Sheet for Healthcare Providers: IncredibleEmployment.be  This test is not yet approved or cleared by the [place] FDA and has been authorized for detection and/or diagnosis of SARS-CoV-2 by FDA under an Emergency Use Authorization (EUA). This EUA will remain in effect (meaning this test can be used) for the duration of the COVID-19 declaration under Section 564(b)(1) of the Act, 21 U.S.C. section 360bbb-3(b)(1), unless the authorization is terminated or revoked.  Performed at [facility], [place] [address], [place], [place] [address]   Protime-INR     Status: None   Collection Time: 08/24/22 10:29 PM  Result Value Ref Range   Prothrombin Time 13.5 11.4 - 15.2 seconds   INR 1.0 0.8 - 1.2    Comment: (NOTE) INR goal varies based on device and disease states. Performed at [facility], [place] [address] [place], [place] [address]   APTT     Status: None   Collection Time: 08/24/22 10:29 PM  Result Value Ref Range   aPTT 33 24 - 36 seconds    Comment: Performed at [facility], [place] [address], [place], [place] [address]   CT Abdomen Pelvis W Contrast  Result Date: 08/24/2022 CLINICAL DATA:  Abdominal pain. EXAM: CT  ABDOMEN AND PELVIS WITH CONTRAST TECHNIQUE: Multidetector CT imaging of the  abdomen and pelvis was performed using the standard protocol following bolus administration of intravenous contrast. RADIATION DOSE REDUCTION: This exam was performed according to the departmental dose-optimization program which includes automated exposure control, adjustment of the mA and/or kV according to patient size and/or use of iterative reconstruction technique. CONTRAST:  144m OMNIPAQUE IOHEXOL 300 MG/ML  SOLN COMPARISON:  CT abdomen pelvis dated 06/23/2022. FINDINGS: Lower chest: The visualized lung bases are clear. No intra-abdominal free air or free fluid. Hepatobiliary: There is fatty infiltration of the liver. Indeterminate 2 cm hypodense lesion in the left lobe of the liver appears to have some peripheral enhancement. This is not characterized on this CT but may represent a hemangioma. Further characterization with MRI without and with contrast on a nonemergent/outpatient basis recommended. No biliary dilatation. The gallbladder is unremarkable. Pancreas: Unremarkable. No pancreatic ductal dilatation or surrounding inflammatory changes. Spleen: Normal in size without focal abnormality. Adrenals/Urinary Tract: The adrenal glands unremarkable. There is no hydronephrosis on either side. There is symmetric enhancement and excretion of contrast by both kidneys. Small right renal upper pole parapelvic cyst. There is mild fullness of the distal left ureter. The right ureter is unremarkable. The urinary bladder is decompressed around a Foley catheter. There is thickened appearance of the bladder wall with perivesical stranding concerning for cystitis. Correlation with urinalysis recommended. Stomach/Bowel: There is moderate stool throughout the colon. There is no bowel obstruction or active inflammation. The appendix is normal. Vascular/Lymphatic: The abdominal aorta and IVC are unremarkable. No portal venous gas. There is no  adenopathy. Reproductive: The prostate and seminal vesicles are grossly unremarkable. No pelvic mass. Other: None Musculoskeletal: Mild degenerative changes. There is right L5 pars defect. No listhesis. No acute osseous pathology. IMPRESSION: 1. Findings concerning for cystitis. Correlation with urinalysis recommended. 2. No bowel obstruction. Normal appendix. 3. Fatty liver. 4. Indeterminate 2 cm hypodense lesion in the left lobe of the liver. Further characterization with MRI without and with contrast on a nonemergent/outpatient basis recommended. Electronically Signed   By: AAnner CreteM.D.   On: 08/24/2022 23:39   DG Chest Port 1 View  Result Date: 08/24/2022 CLINICAL DATA:  Fever, leg swelling, and weakness. EXAM: PORTABLE CHEST 1 VIEW COMPARISON:  09/18/2019 FINDINGS: The heart size and mediastinal contours are within normal limits. Both lungs are clear. The visualized skeletal structures are unremarkable. IMPRESSION: No active disease. Electronically Signed   By: WLucienne CapersM.D.   On: 08/24/2022 21:50    Pending Labs Unresulted Labs (From admission, onward)     Start     Ordered   09/01/22 0500  Creatinine, serum  (enoxaparin (LOVENOX)    CrCl >/= 30 ml/min)  Weekly,   R     Comments: while on enoxaparin therapy    08/25/22 0131   08/25/22 0500  HIV Antibody (routine testing w rflx)  (HIV Antibody (Routine testing w reflex) panel)  Tomorrow morning,   R        08/25/22 0131   08/25/22 [id number] Basic metabolic panel  Daily,   R      08/25/22 0131   08/25/22 0500  Magnesium  Tomorrow morning,   R        08/25/22 0131   08/25/22 0500  Phosphorus  Tomorrow morning,   R        08/25/22 0131   08/25/22 0500  CBC  Daily,   R      08/25/22 0131   08/24/22 2321  Urine Culture (for pregnant, neutropenic  or urologic patients or patients with an indwelling urinary catheter)  (Urine Labs)  Once,   URGENT       Question:  Indication  Answer:  Sepsis   08/24/22 2320   08/24/22 2130   Culture, blood (routine x 2)  BLOOD CULTURE X 2,   R (with STAT occurrences)      08/24/22 2130            Vitals/Pain Today's Vitals   08/24/22 2038 08/24/22 2215 08/24/22 2330 08/25/22 0100  BP:  (!) 143/85 122/68   Pulse:  84 87   Resp:  (!) 22 20   Temp:    99.4 F (37.4 C)  TempSrc:    Oral  SpO2:  96% 97%   PainSc: 6        Isolation Precautions No active isolations  Medications Medications  lactated ringers infusion (has no administration in time range)  venlafaxine XR (EFFEXOR-XR) 24 hr capsule 150 mg (has no administration in time range)  thyroid (ARMOUR) tablet 120 mg (has no administration in time range)  tamsulosin (FLOMAX) capsule 0.4 mg (has no administration in time range)  enoxaparin (LOVENOX) injection 40 mg (has no administration in time range)  acetaminophen (TYLENOL) tablet 650 mg (has no administration in time range)    Or  acetaminophen (TYLENOL) suppository 650 mg (has no administration in time range)  oxyCODONE (Oxy IR/ROXICODONE) immediate release tablet 5 mg (has no administration in time range)  HYDROmorphone (DILAUDID) injection 0.5-1 mg (has no administration in time range)  polyethylene glycol (MIRALAX / GLYCOLAX) packet 17 g (has no administration in time range)  ondansetron (ZOFRAN) tablet 4 mg (has no administration in time range)    Or  ondansetron (ZOFRAN) injection 4 mg (has no administration in time range)  ceFEPIme (MAXIPIME) 2 g in sodium chloride 0.9 % 100 mL IVPB (has no administration in time range)  acetaminophen (TYLENOL) tablet 650 mg (650 mg Oral Given 08/24/22 2300)  sodium chloride 0.9 % bolus 1,000 mL (1,000 mLs Intravenous Bolus 08/24/22 2300)  iohexol (OMNIPAQUE) 300 MG/ML solution 100 mL (100 mLs Intravenous Contrast Given 08/24/22 2316)  cefTRIAXone (ROCEPHIN) 1 g in sodium chloride 0.9 % 100 mL IVPB (0 g Intravenous Stopped 08/25/22 0053)  morphine (PF) 4 MG/ML injection 4 mg (4 mg Intravenous Given 08/25/22 0052)   ondansetron (ZOFRAN) injection 4 mg (4 mg Intravenous Given 08/25/22 0052)    Mobility walks     Focused Assessments    R Recommendations: See Admitting Provider Note  Report given to:   Additional Notes:

## 2022-08-25 NOTE — Progress Notes (Signed)
Care started prior to midnight in the emergency room and patient was admitted early this morning after midnight by Dr. Christia Reading Opyd and I am in current agreement with this assessment and plan.  Additional changes to plan of care been made accordingly.  The patient is a 52 year old male with a past medical history significant for but not limited to depression, anxiety, hypothyroidism, sleep apnea, ADHD, osteoarthritis, BPH with lower urinary tract symptoms status post robotic water jet ablation of his prostate on 08/22/2022 as well as other comorbidities presented to the ED with fever, fatigue, low back pain and bladder spasms.  He was reportedly started on ciprofloxacin roughly a week ago for his gross hematuria and bladder spasms and stopped taking the medication after his recent prostate ablation but states that he contacted urology clinic and was told to resume ciprofloxacin based on some test results to have.  Began experiencing fever yesterday with increased bladder spasm and low back pain and fatigue.  Symptoms worsened throughout the course of the day and he has been experiencing increased chronic bilateral knee pain but was more severe.  On arrival to the ED he had a CT of the abdomen pelvis which was notable for urinary bladder wall thickening with perivesicular stranding and a CT was notable for 2 cm hypodense lesion in the left liver lobe concerning for hemangioma but recommended further outpatient MRI for characterization.  Blood cultures and urine culture obtained and he was given 1 L normal saline and placed on IV ceftriaxone, morphine and Zofran.  Currently he is being admitted and treated for the following but not limited to:   Staph epidermidis UTI  - Fever and leukocytosis noted in ED and concerning for early sepsis though BP is stable and lactate normal  -WBC went from 13.2 is now 12.2 and lactic acid level is 1.3 - Blood and urine cultures collected in ED and antibiotics started  - Treat  with cefepime for now, follow cultures and clinical course -I received a message from the urologist Dr. Gloriann Loan who showed me his sensitivities of his recent urinary tract infection.  There is currently as below:  Iso/Result : 01 Staphylococcus epidermidis  Antimicrobic/Dose MIC SYSTEMIC URINE  ---------------------------------------------------------------  Ciprofloxacin <1 S S  Daptomycin <0.5 S S  Nitrofurantoin <32 S  Gentamicin >8 R R  Levofloxacin <1 S S  Linezolid 2 S S  Oxacillin >2 R R  Penicillin >8 R R  Rifampin <1 S S  Trimeth/Sulfa >2/38 R R  Tetracycline >8 R R  Vancomycin 2 S S   These results were from 08/18/2022.  Current blood cultures and urine culture here are pending -Urinalysis here showed hazy appearance with large hemoglobin, large leukocytes, rare bacteria, present mucus, greater than 50 RBCs per high-power field, greater than 50 WBCs -Urology recommending to do a trial of void in the a.m. and recommending continuing IV antibiotics while awaiting final urine culture -Patient was having significant pain so we will give him IV ketorolac for his knee pain and get bilateral x-rays   AKI  - SCr is 1.52 on admission, up form 1.00 on February 6th  - No hydronephrosis on CT in ED  - Likely mild prerenal azotemia in setting of acute illness with loss of appetite  - Continue IVF hydration -BUN/Cr Trend: Recent Labs  Lab 08/09/22 1118 08/24/22 2155 08/25/22 0435  BUN 14 24* 20  CREATININE 1.00 1.52* 1.21  -Avoid Nephrotoxic Medications but will give a dose of IV ketorolac x  1, Contrast Dyes, Hypotension and Dehydration to Ensure Adequate Renal Perfusion and will need to Renally Adjust Meds -Continue to Monitor and Trend Renal Function carefully and repeat CMP in the AM   Hypothyroidism  - Continue desiccated thyroid     OSA  - Continue CPAP qHS     Depression  - Continue SNRI    Bilateral knee pain -Left knee x-ray done and showed "Mild  degenerative changes LEFT knee with moderate joint effusion. No acute osseous findings." -Right Knee X-Ray done and showed "No significant osseous abnormalities." -Will give him a dose of IV ketorolac 30 mg x 1 -Will discuss with orthopedic surgery about possible joint aspiration and injection notified Dr. Marlou Sa will come evaluate the patient later   Liver lesion  - Noted incidentally on CT in ED  - Discussed with patient, outpatient MRI recommended   Normocytic Anemia -Hgb/Hct Trend: Recent Labs  Lab 08/09/22 1118 08/23/22 0522 08/24/22 2155 08/25/22 0435  HGB 14.9 13.4 13.8 12.7*  HCT 44.7 40.4 41.3 37.9*  MCV 86.5  --  86.4 86.3  -Check Anemia Panel in the AM  -Continue to Monitor for S/Sx of Bleeding: no overt bleeding noted -Repeat CBC in the AM   Obesity -Complicates overall prognosis and care -Estimated body mass index is 32.75 kg/m as calculated from the following:   Height as of this encounter: 6' 2"$  (1.88 m).   Weight as of this encounter: 115.7 kg.  -Weight Loss and Dietary Counseling given  Will continue to monitor the patient's clinical response to intervention and repeat blood work in the a.m. and follow-up on specialist recommendations.

## 2022-08-25 NOTE — Progress Notes (Signed)
Urology Progress Note   61M with recent aquablation for BPH, returns with likely UTI.   Subjective: NAEON.  Urine culture pending. Main complaint this AM is bilateral knee pain. No calf pain or symptoms of DVT, feel this is likely reactive arthritis  Objective: Vital signs in last 24 hours: Temp:  [98.5 F (36.9 C)-101.7 F (38.7 C)] 98.5 F (36.9 C) (02/22 0524) Pulse Rate:  [73-93] 74 (02/22 0524) Resp:  [16-22] 18 (02/22 0524) BP: (122-143)/(68-90) 129/87 (02/22 0524) SpO2:  [93 %-98 %] 95 % (02/22 0524) FiO2 (%):  [21 %] 21 % (02/22 0336) Weight:  [115.7 kg] 115.7 kg (02/22 0225)  Intake/Output from previous day: 02/21 0701 - 02/22 0700 In: 340 [P.O.:240; IV Piggyback:100] Out: 400 [Urine:400] Intake/Output this shift: No intake/output data recorded.  Physical Exam:  General: Alert and oriented CV: Regular rate Lungs: No increased work of breathing Abdomen:  Soft, appropriately tender. GU: Foley in place draining clear yellow urine  Ext: NT, No erythema. Bilateral calves soft and non-erythematous. Bilateral knees with mild swelling and tenderness to palpation, no erythema, no warmth  Lab Results: Recent Labs    08/23/22 0522 08/24/22 2155 08/25/22 0435  HGB 13.4 13.8 12.7*  HCT 40.4 41.3 37.9*   Recent Labs    08/24/22 2155 08/25/22 0435  NA 137 135  K 3.5 3.7  CL 103 103  CO2 24 24  GLUCOSE 120* 116*  BUN 24* 20  CREATININE 1.52* 1.21  CALCIUM 8.4* 7.8*    Studies/Results: CT Abdomen Pelvis W Contrast  Result Date: 08/24/2022 CLINICAL DATA:  Abdominal pain. EXAM: CT ABDOMEN AND PELVIS WITH CONTRAST TECHNIQUE: Multidetector CT imaging of the abdomen and pelvis was performed using the standard protocol following bolus administration of intravenous contrast. RADIATION DOSE REDUCTION: This exam was performed according to the departmental dose-optimization program which includes automated exposure control, adjustment of the mA and/or kV according to  patient size and/or use of iterative reconstruction technique. CONTRAST:  170m OMNIPAQUE IOHEXOL 300 MG/ML  SOLN COMPARISON:  CT abdomen pelvis dated 06/23/2022. FINDINGS: Lower chest: The visualized lung bases are clear. No intra-abdominal free air or free fluid. Hepatobiliary: There is fatty infiltration of the liver. Indeterminate 2 cm hypodense lesion in the left lobe of the liver appears to have some peripheral enhancement. This is not characterized on this CT but may represent a hemangioma. Further characterization with MRI without and with contrast on a nonemergent/outpatient basis recommended. No biliary dilatation. The gallbladder is unremarkable. Pancreas: Unremarkable. No pancreatic ductal dilatation or surrounding inflammatory changes. Spleen: Normal in size without focal abnormality. Adrenals/Urinary Tract: The adrenal glands unremarkable. There is no hydronephrosis on either side. There is symmetric enhancement and excretion of contrast by both kidneys. Small right renal upper pole parapelvic cyst. There is mild fullness of the distal left ureter. The right ureter is unremarkable. The urinary bladder is decompressed around a Foley catheter. There is thickened appearance of the bladder wall with perivesical stranding concerning for cystitis. Correlation with urinalysis recommended. Stomach/Bowel: There is moderate stool throughout the colon. There is no bowel obstruction or active inflammation. The appendix is normal. Vascular/Lymphatic: The abdominal aorta and IVC are unremarkable. No portal venous gas. There is no adenopathy. Reproductive: The prostate and seminal vesicles are grossly unremarkable. No pelvic mass. Other: None Musculoskeletal: Mild degenerative changes. There is right L5 pars defect. No listhesis. No acute osseous pathology. IMPRESSION: 1. Findings concerning for cystitis. Correlation with urinalysis recommended. 2. No bowel obstruction. Normal appendix. 3. Fatty  liver. 4.  Indeterminate 2 cm hypodense lesion in the left lobe of the liver. Further characterization with MRI without and with contrast on a nonemergent/outpatient basis recommended. Electronically Signed   By: Anner Crete M.D.   On: 08/24/2022 23:39   DG Chest Port 1 View  Result Date: 08/24/2022 CLINICAL DATA:  Fever, leg swelling, and weakness. EXAM: PORTABLE CHEST 1 VIEW COMPARISON:  09/18/2019 FINDINGS: The heart size and mediastinal contours are within normal limits. Both lungs are clear. The visualized skeletal structures are unremarkable. IMPRESSION: No active disease. Electronically Signed   By: Lucienne Capers M.D.   On: 08/24/2022 21:50    Assessment/Plan:  52 y.o. male s/p aquablation.  Overall doing well post-op.   - Continue abx while awaiting urine culture final - trial of void tomorrow AM - ok for toradol or other NSAID for likely reactive arthritis. Very low suspicion for DVT but will defer to primary team if additional workup warranted  Dispo: floor   LOS: 0 days

## 2022-08-25 NOTE — H&P (Signed)
History and Physical    Timothy Villarreal [id number] DOB: 06-23-1971 DOA: 08/24/2022  PCP: [name], NP   Patient coming from: Home   Chief Complaint: Fever, fatigue, low back pain, bladder spasms  HPI: Timothy Villarreal is a pleasant 52 y.o. male with medical history significant for depression, anxiety, hypothyroidism, sleep apnea, ADHD, osteoarthritis, BPH with LUTS status post robotic water jet ablation of the prostate on 08/22/2022, now presenting to the emergency department with fever, fatigue, low back pain, and bladder spasms.  Patient reports he was started on ciprofloxacin roughly a week ago for gross hematuria and bladder spasms, and stopped taking the medication after his recent prostate ablation, but states that he was contacted by the urology clinic today and told to resume ciprofloxacin based on some test results they had.  He began experiencing fever yesterday afternoon with increased bladder spasm, low back pain, and fatigue.  Symptoms worsen throughout the course of the day.  He has also been experiencing increase in his chronic bilateral knee pain which is the same in character as his typical pain but more severe.  ED Course: Upon arrival to the ED, patient is found to be febrile to 38.7 with mild tachypnea, normal heart rate, and stable blood pressure.  Blood work is notable for WBC 13,200, lactic acid 1.3, and creatinine 1.52.  CT of the abdomen and pelvis is notable for urinary bladder wall thickening with perivesicular stranding.  CT is also notable for 2 cm hypodense lesion in the left liver lobe.  Blood and urine cultures were collected in the ED and the patient was given 1 L of normal saline, acetaminophen, Rocephin, morphine, and Zofran.  Review of Systems:  All other systems reviewed and apart from HPI, are negative.  Past Medical History:  Diagnosis Date   ADHD (attention deficit hyperactivity disorder)    Anxiety    Arthritis    Cancer ([place])     thyroid cancer   Complication of anesthesia    nausea / vomit (over 25 yrs ago)   Hypothyroidism    Hx thyroidectomy  2007   Sleep apnea     Past Surgical History:  Procedure Laterality Date   EYE SURGERY     Lasik surgery   THYROIDECTOMY  2007    Social History:   reports that he has never smoked. He has never used smokeless tobacco. He reports that he does not currently use alcohol. He reports that he does not currently use drugs.  No Known Allergies  History reviewed. No pertinent family history.   Prior to Admission medications   Medication Sig Start Date End Date Taking? Authorizing Provider  ALEVE 220 MG tablet Take 220-440 mg by mouth 2 (two) times daily as needed (for mild pain or headaches).   Yes [provider]  ARMOUR THYROID 120 MG tablet Take 120 mg by mouth daily before breakfast. 05/17/22  Yes [provider]  ciprofloxacin (CIPRO) 500 MG tablet Take 500 mg by mouth 2 (two) times daily. 08/18/22 08/25/22 Yes [provider]  desvenlafaxine (PRISTIQ) 100 MG 24 hr tablet Take 100 mg by mouth at bedtime. 04/06/22  Yes [provider]  HYDROcodone-acetaminophen (NORCO) 5-325 MG tablet Take 1 tablet by mouth every 4 (four) hours as needed for moderate pain. 08/22/22  Yes Marton Redwood III, MD  JORNAY PM 100 MG CP24 Take 100 mg by mouth at bedtime. 06/07/22  Yes [provider]  NON FORMULARY Take 1-2 tablets by mouth  See admin instructions. CBD gummies- Chew 1-2 gummies by mouth at bedtime as needed for sleep   Yes [provider]  PRESCRIPTION MEDICATION CPAP- At bedtime   Yes [provider]  tamsulosin (FLOMAX) 0.4 MG CAPS capsule Take 0.4 mg by mouth daily after breakfast.   Yes [provider]    Physical Exam: Vitals:   08/24/22 2037 08/24/22 2215 08/24/22 2330 08/25/22 0100  BP: (!) 141/90 (!) 143/85 122/68   Pulse: 93 84 87   Resp: (!) 22 (!) 22 20   Temp: (!) 101.7 F (38.7 C)   99.4 F  (37.4 C)  TempSrc: Oral   Oral  SpO2: 98% 96% 97%     Constitutional: NAD, calm  Eyes: PERTLA, lids and conjunctivae normal ENMT: Mucous membranes are moist. Posterior pharynx clear of any exudate or lesions.   Neck: supple, no masses  Respiratory: no wheezing, no crackles. No accessory muscle use.  Cardiovascular: S1 & S2 heard, regular rate and rhythm. No extremity edema. No significant JVD. Abdomen: No distension, no tenderness, soft. Bowel sounds active.  Musculoskeletal: no clubbing / cyanosis. Anterior knee tenderness b/l but no erythema or heat.   Skin: no significant rashes, lesions, ulcers. Warm, dry, well-perfused. Neurologic: CN 2-12 grossly intact. Moving all extremities. Alert and oriented.  Psychiatric: Pleasant. Cooperative.    Labs and Imaging on Admission: I have personally reviewed following labs and imaging studies  CBC: Recent Labs  Lab 08/23/22 0522 08/24/22 2155  WBC  --  13.2*  NEUTROABS  --  10.2*  HGB 13.4 13.8  HCT 40.4 41.3  MCV  --  86.4  PLT  --  [id number]   Basic Metabolic Panel: Recent Labs  Lab 08/24/22 2155  NA 137  K 3.5  CL 103  CO2 24  GLUCOSE 120*  BUN 24*  CREATININE 1.52*  CALCIUM 8.4*   GFR: Estimated Creatinine Clearance: 74.3 mL/min (A) (by C-G formula based on SCr of 1.52 mg/dL (H)). Liver Function Tests: Recent Labs  Lab 08/24/22 2155  AST 13*  ALT 13  ALKPHOS 63  BILITOT 0.8  PROT 6.1*  ALBUMIN 3.7   No results for input(s): "LIPASE", "AMYLASE" in the last 168 hours. No results for input(s): "AMMONIA" in the last 168 hours. Coagulation Profile: Recent Labs  Lab 08/24/22 2229  INR 1.0   Cardiac Enzymes: No results for input(s): "CKTOTAL", "CKMB", "CKMBINDEX", "TROPONINI" in the last 168 hours. BNP (last 3 results) No results for input(s): "PROBNP" in the last 8760 hours. HbA1C: No results for input(s): "HGBA1C" in the last 72 hours. CBG: No results for input(s): "GLUCAP" in the last 168 hours. Lipid  Profile: No results for input(s): "CHOL", "HDL", "LDLCALC", "TRIG", "CHOLHDL", "LDLDIRECT" in the last 72 hours. Thyroid Function Tests: No results for input(s): "TSH", "T4TOTAL", "FREET4", "T3FREE", "THYROIDAB" in the last 72 hours. Anemia Panel: No results for input(s): "VITAMINB12", "FOLATE", "FERRITIN", "TIBC", "IRON", "RETICCTPCT" in the last 72 hours. Urine analysis:    Component Value Date/Time   COLORURINE YELLOW 08/24/2022 2130   APPEARANCEUR HAZY (A) 08/24/2022 2130   LABSPEC 1.023 08/24/2022 2130   PHURINE 6.0 08/24/2022 2130   GLUCOSEU NEGATIVE 08/24/2022 2130   HGBUR LARGE (A) 08/24/2022 2130   BILIRUBINUR NEGATIVE 08/24/2022 2130   [place] NEGATIVE 08/24/2022 2130   PROTEINUR 100 (A) 08/24/2022 2130   NITRITE NEGATIVE 08/24/2022 2130   LEUKOCYTESUR LARGE (A) 08/24/2022 2130   Sepsis Labs: @LABRCNTIP$ (procalcitonin:4,lacticidven:4) ) Recent Results (from the past 240 hour(s))  Resp panel  by RT-PCR (RSV, Flu A&B, Covid)     Status: None   Collection Time: 08/24/22  9:55 PM   Specimen: Nasal Swab  Result Value Ref Range Status   SARS Coronavirus 2 by RT PCR NEGATIVE NEGATIVE Final    Comment: (NOTE) SARS-CoV-2 target nucleic acids are NOT DETECTED.  The SARS-CoV-2 RNA is generally detectable in upper respiratory specimens during the acute phase of infection. The lowest concentration of SARS-CoV-2 viral copies this assay can detect is 138 copies/mL. A negative result does not preclude SARS-Cov-2 infection and should not be used as the sole basis for treatment or other patient management decisions. A negative result may occur with  improper specimen collection/handling, submission of specimen other than nasopharyngeal swab, presence of viral mutation(s) within the areas targeted by this assay, and inadequate number of viral copies(<138 copies/mL). A negative result must be combined with clinical observations, patient history, and epidemiological information. The  expected result is Negative.  Fact Sheet for Patients:  EntrepreneurPulse.com.au  Fact Sheet for Healthcare Providers:  IncredibleEmployment.be  This test is no t yet approved or cleared by the [place] FDA and  has been authorized for detection and/or diagnosis of SARS-CoV-2 by FDA under an Emergency Use Authorization (EUA). This EUA will remain  in effect (meaning this test can be used) for the duration of the COVID-19 declaration under Section 564(b)(1) of the Act, 21 U.S.C.section 360bbb-3(b)(1), unless the authorization is terminated  or revoked sooner.       Influenza A by PCR NEGATIVE NEGATIVE Final   Influenza B by PCR NEGATIVE NEGATIVE Final    Comment: (NOTE) The Xpert Xpress SARS-CoV-2/FLU/RSV plus assay is intended as an aid in the diagnosis of influenza from Nasopharyngeal swab specimens and should not be used as a sole basis for treatment. Nasal washings and aspirates are unacceptable for Xpert Xpress SARS-CoV-2/FLU/RSV testing.  Fact Sheet for Patients: EntrepreneurPulse.com.au  Fact Sheet for Healthcare Providers: IncredibleEmployment.be  This test is not yet approved or cleared by the [place] FDA and has been authorized for detection and/or diagnosis of SARS-CoV-2 by FDA under an Emergency Use Authorization (EUA). This EUA will remain in effect (meaning this test can be used) for the duration of the COVID-19 declaration under Section 564(b)(1) of the Act, 21 U.S.C. section 360bbb-3(b)(1), unless the authorization is terminated or revoked.     Resp Syncytial Virus by PCR NEGATIVE NEGATIVE Final    Comment: (NOTE) Fact Sheet for Patients: EntrepreneurPulse.com.au  Fact Sheet for Healthcare Providers: IncredibleEmployment.be  This test is not yet approved or cleared by the [place] FDA and has been authorized for detection and/or  diagnosis of SARS-CoV-2 by FDA under an Emergency Use Authorization (EUA). This EUA will remain in effect (meaning this test can be used) for the duration of the COVID-19 declaration under Section 564(b)(1) of the Act, 21 U.S.C. section 360bbb-3(b)(1), unless the authorization is terminated or revoked.  Performed at [facility], [place] [address], [place], [place] [address]      Radiological Exams on Admission: CT Abdomen Pelvis W Contrast  Result Date: 08/24/2022 CLINICAL DATA:  Abdominal pain. EXAM: CT ABDOMEN AND PELVIS WITH CONTRAST TECHNIQUE: Multidetector CT imaging of the abdomen and pelvis was performed using the standard protocol following bolus administration of intravenous contrast. RADIATION DOSE REDUCTION: This exam was performed according to the departmental dose-optimization program which includes automated exposure control, adjustment of the mA and/or kV according to patient size and/or use of iterative reconstruction technique. CONTRAST:  177m OMNIPAQUE IOHEXOL 300 MG/ML  SOLN COMPARISON:  CT abdomen pelvis dated 06/23/2022. FINDINGS: Lower chest: The visualized lung bases are clear. No intra-abdominal free air or free fluid. Hepatobiliary: There is fatty infiltration of the liver. Indeterminate 2 cm hypodense lesion in the left lobe of the liver appears to have some peripheral enhancement. This is not characterized on this CT but may represent a hemangioma. Further characterization with MRI without and with contrast on a nonemergent/outpatient basis recommended. No biliary dilatation. The gallbladder is unremarkable. Pancreas: Unremarkable. No pancreatic ductal dilatation or surrounding inflammatory changes. Spleen: Normal in size without focal abnormality. Adrenals/Urinary Tract: The adrenal glands unremarkable. There is no hydronephrosis on either side. There is symmetric enhancement and excretion of contrast by both kidneys. Small right renal upper pole  parapelvic cyst. There is mild fullness of the distal left ureter. The right ureter is unremarkable. The urinary bladder is decompressed around a Foley catheter. There is thickened appearance of the bladder wall with perivesical stranding concerning for cystitis. Correlation with urinalysis recommended. Stomach/Bowel: There is moderate stool throughout the colon. There is no bowel obstruction or active inflammation. The appendix is normal. Vascular/Lymphatic: The abdominal aorta and IVC are unremarkable. No portal venous gas. There is no adenopathy. Reproductive: The prostate and seminal vesicles are grossly unremarkable. No pelvic mass. Other: None Musculoskeletal: Mild degenerative changes. There is right L5 pars defect. No listhesis. No acute osseous pathology. IMPRESSION: 1. Findings concerning for cystitis. Correlation with urinalysis recommended. 2. No bowel obstruction. Normal appendix. 3. Fatty liver. 4. Indeterminate 2 cm hypodense lesion in the left lobe of the liver. Further characterization with MRI without and with contrast on a nonemergent/outpatient basis recommended. Electronically Signed   By: AAnner CreteM.D.   On: 08/24/2022 23:39   DG Chest Port 1 View  Result Date: 08/24/2022 CLINICAL DATA:  Fever, leg swelling, and weakness. EXAM: PORTABLE CHEST 1 VIEW COMPARISON:  09/18/2019 FINDINGS: The heart size and mediastinal contours are within normal limits. Both lungs are clear. The visualized skeletal structures are unremarkable. IMPRESSION: No active disease. Electronically Signed   By: WLucienne CapersM.D.   On: 08/24/2022 21:50    EKG: Independently reviewed. Sinus rhythm, incomplete RBBB and LAFB.   Assessment/Plan   1. UTI  - Fever and leukocytosis noted in ED and concerning for early sepsis though BP is stable and lactate normal  - Blood and urine cultures collected in ED and antibiotics started  - Treat with cefepime for now, follow cultures and clinical course    2. AKI   - SCr is 1.52 on admission, up form 1.00 on February 6th  - No hydronephrosis on CT in ED  - Likely mild prerenal azotemia in setting of acute illness with loss of appetite  - Continue IVF hydration, renally-dose medications, repeat chem panel in am    3. Hypothyroidism  - Continue desiccated thyroid    4. OSA  - Continue CPAP qHS    5. Depression  - Continue SNRI    6. Liver lesion  - Noted incidentally on CT in ED  - Discussed with patient, outpatient MRI recommended    DVT prophylaxis: Lovenox  Code Status: Full  Level of Care: Level of care: Med-Surg Family Communication: None present  Disposition Plan:  Patient is from: home  Anticipated d/c is to: Home  Anticipated d/c date is: 08/27/22  Patient currently: Pending cultures, stable renal function, transition to oral medications  Consults called: none  Admission status: Inpatient  Vianne Bulls, MD [facility]  08/25/2022, 1:31 AM

## 2022-08-25 NOTE — Progress Notes (Signed)
Pharmacy Antibiotic Note  Timothy Villarreal is a 52 y.o. male admitted on 08/24/2022 with concerns for fevers, lower back pain, generalized weakness, and increased bilateral leg swelling. Per note pt was recently hospitalized following ablation of prostate with complication of UTI. Marland Kitchen  Pharmacy has been consulted for cefepime dosing.  Plan: Cefepime 2gm IV q8h Follow renal function and clinical course     Temp (24hrs), Avg:100.6 F (38.1 C), Min:99.4 F (37.4 C), Max:101.7 F (38.7 C)  Recent Labs  Lab 08/24/22 2155  WBC 13.2*  CREATININE 1.52*  LATICACIDVEN 1.3    Estimated Creatinine Clearance: 74.3 mL/min (A) (by C-G formula based on SCr of 1.52 mg/dL (H)).    No Known Allergies  Antimicrobials this admission: 2/21 CTX x 1  Dose adjustments this admission:   Microbiology results: 2/21 BCx:  2/21 UCx:    Thank you for allowing pharmacy to be a part of this patient's care.  Dolly Rias RPh 08/25/2022, 1:36 AM

## 2022-08-25 NOTE — Consult Note (Addendum)
Reason for Consult: Bilateral knee pain Referring Physician: Dr. Doreen Salvage is an 52 y.o. male.  HPI: Timothy Villarreal is a patient admitted for evaluation of issues related to his recent prostate surgery.  Noted to develop bilateral knee pain left worse than right several days ago.  Having fevers in the hospital.  Difficulty ambulating.  Denies any history of trauma or recent injury to the knee.  Notably he does have a history of gout affecting the left foot.  Past Medical History:  Diagnosis Date   ADHD (attention deficit hyperactivity disorder)    Anxiety    Arthritis    Cancer ([place])    thyroid cancer   Complication of anesthesia    nausea / vomit (over 25 yrs ago)   Hypothyroidism    Hx thyroidectomy  2007   Sleep apnea     Past Surgical History:  Procedure Laterality Date   EYE SURGERY     Lasik surgery   THYROIDECTOMY  2007    History reviewed. No pertinent family history.  Social History:  reports that he has never smoked. He has never used smokeless tobacco. He reports that he does not currently use alcohol. He reports that he does not currently use drugs.  Allergies: No Known Allergies  Medications: I have reviewed the patient's current medications.  Results for orders placed or performed during the hospital encounter of 08/24/22 (from the past 48 hour(s))  Urinalysis, Routine w reflex microscopic -Urine, Clean Catch     Status: Abnormal   Collection Time: 08/24/22  9:30 PM  Result Value Ref Range   Color, Urine YELLOW YELLOW   APPearance HAZY (A) CLEAR   Specific Gravity, Urine 1.023 1.005 - 1.030   pH 6.0 5.0 - 8.0   Glucose, UA NEGATIVE NEGATIVE mg/dL   Hgb urine dipstick LARGE (A) NEGATIVE   Bilirubin Urine NEGATIVE NEGATIVE   Ketones, ur NEGATIVE NEGATIVE mg/dL   Protein, ur 100 (A) NEGATIVE mg/dL   Nitrite NEGATIVE NEGATIVE   Leukocytes,Ua LARGE (A) NEGATIVE   RBC / HPF >50 0 - 5 RBC/hpf   WBC, UA >50 0 - 5 WBC/hpf   Bacteria, UA RARE (A) NONE  SEEN   Squamous Epithelial / HPF 0-5 0 - 5 /HPF   Mucus PRESENT     Comment: Performed at [facility], [place] [address], [place], [place] [address]  CBC with Differential     Status: Abnormal   Collection Time: 08/24/22  9:55 PM  Result Value Ref Range   WBC 13.2 (H) 4.0 - 10.5 K/uL   RBC 4.78 4.22 - 5.81 MIL/uL   Hemoglobin 13.8 13.0 - 17.0 g/dL   HCT 41.3 39.0 - 52.0 %   MCV 86.4 80.0 - 100.0 fL   MCH 28.9 26.0 - 34.0 pg   MCHC 33.4 30.0 - 36.0 g/dL   RDW 13.2 11.5 - 15.5 %   Platelets 234 150 - 400 K/uL   nRBC 0.0 0.0 - 0.2 %   Neutrophils Relative % 75 %   Neutro Abs 10.2 (H) 1.7 - 7.7 K/uL   Lymphocytes Relative 11 %   Lymphs Abs 1.4 0.7 - 4.0 K/uL   Monocytes Relative 11 %   Monocytes Absolute 1.4 (H) 0.1 - 1.0 K/uL   Eosinophils Relative 1 %   Eosinophils Absolute 0.1 0.0 - 0.5 K/uL   Basophils Relative 1 %   Basophils Absolute 0.1 0.0 - 0.1 K/uL   Immature Granulocytes 1 %  Abs Immature Granulocytes 0.06 0.00 - 0.07 K/uL    Comment: Performed at [facility], [place] [address] [place], [place] [address]  Lactic acid, plasma     Status: None   Collection Time: 08/24/22  9:55 PM  Result Value Ref Range   Lactic Acid, Venous 1.3 0.5 - 1.9 mmol/L    Comment: Performed at [facility], [place] [address] [place], [place] [address]  Troponin I (High Sensitivity)     Status: None   Collection Time: 08/24/22  9:55 PM  Result Value Ref Range   Troponin I (High Sensitivity) 3 <18 ng/L    Comment: (NOTE) Elevated high sensitivity troponin I (hsTnI) values and significant  changes across serial measurements may suggest ACS but many other  chronic and acute conditions are known to elevate hsTnI results.  Refer to the "Links" section for chest pain algorithms and additional  guidance. Performed at [facility], [place] [address] [place], [place] [address]   Comprehensive metabolic panel     Status: Abnormal    Collection Time: 08/24/22  9:55 PM  Result Value Ref Range   Sodium 137 135 - 145 mmol/L   Potassium 3.5 3.5 - 5.1 mmol/L   Chloride 103 98 - 111 mmol/L   CO2 24 22 - 32 mmol/L   Glucose, Bld 120 (H) 70 - 99 mg/dL    Comment: Glucose reference range applies only to samples taken after fasting for at least 8 hours.   BUN 24 (H) 6 - 20 mg/dL   Creatinine, Ser 1.52 (H) 0.61 - 1.24 mg/dL   Calcium 8.4 (L) 8.9 - 10.3 mg/dL   Total Protein 6.1 (L) 6.5 - 8.1 g/dL   Albumin 3.7 3.5 - 5.0 g/dL   AST 13 (L) 15 - 41 U/L   ALT 13 0 - 44 U/L   Alkaline Phosphatase 63 38 - 126 U/L   Total Bilirubin 0.8 0.3 - 1.2 mg/dL   GFR, Estimated 54 (L) >60 mL/min    Comment: (NOTE) Calculated using the CKD-EPI Creatinine Equation (2021)    Anion gap 10 5 - 15    Comment: Performed at [facility], [place] [address] [place], [place] [address]  Resp panel by RT-PCR (RSV, Flu A&B, Covid)     Status: None   Collection Time: 08/24/22  9:55 PM   Specimen: Nasal Swab  Result Value Ref Range   SARS Coronavirus 2 by RT PCR NEGATIVE NEGATIVE    Comment: (NOTE) SARS-CoV-2 target nucleic acids are NOT DETECTED.  The SARS-CoV-2 RNA is generally detectable in upper respiratory specimens during the acute phase of infection. The lowest concentration of SARS-CoV-2 viral copies this assay can detect is 138 copies/mL. A negative result does not preclude SARS-Cov-2 infection and should not be used as the sole basis for treatment or other patient management decisions. A negative result may occur with  improper specimen collection/handling, submission of specimen other than nasopharyngeal swab, presence of viral mutation(s) within the areas targeted by this assay, and inadequate number of viral copies(<138 copies/mL). A negative result must be combined with clinical observations, patient history, and epidemiological information. The expected result is Negative.  Fact Sheet for Patients:   EntrepreneurPulse.com.au  Fact Sheet for Healthcare Providers:  IncredibleEmployment.be  This test is no t yet approved or cleared by the [place] FDA and  has been authorized for detection and/or diagnosis of SARS-CoV-2 by FDA under an Emergency Use Authorization (EUA). This EUA  will remain  in effect (meaning this test can be used) for the duration of the COVID-19 declaration under Section 564(b)(1) of the Act, 21 U.S.C.section 360bbb-3(b)(1), unless the authorization is terminated  or revoked sooner.       Influenza A by PCR NEGATIVE NEGATIVE   Influenza B by PCR NEGATIVE NEGATIVE    Comment: (NOTE) The Xpert Xpress SARS-CoV-2/FLU/RSV plus assay is intended as an aid in the diagnosis of influenza from Nasopharyngeal swab specimens and should not be used as a sole basis for treatment. Nasal washings and aspirates are unacceptable for Xpert Xpress SARS-CoV-2/FLU/RSV testing.  Fact Sheet for Patients: EntrepreneurPulse.com.au  Fact Sheet for Healthcare Providers: IncredibleEmployment.be  This test is not yet approved or cleared by the [place] FDA and has been authorized for detection and/or diagnosis of SARS-CoV-2 by FDA under an Emergency Use Authorization (EUA). This EUA will remain in effect (meaning this test can be used) for the duration of the COVID-19 declaration under Section 564(b)(1) of the Act, 21 U.S.C. section 360bbb-3(b)(1), unless the authorization is terminated or revoked.     Resp Syncytial Virus by PCR NEGATIVE NEGATIVE    Comment: (NOTE) Fact Sheet for Patients: EntrepreneurPulse.com.au  Fact Sheet for Healthcare Providers: IncredibleEmployment.be  This test is not yet approved or cleared by the [place] FDA and has been authorized for detection and/or diagnosis of SARS-CoV-2 by FDA under an Emergency Use Authorization (EUA).  This EUA will remain in effect (meaning this test can be used) for the duration of the COVID-19 declaration under Section 564(b)(1) of the Act, 21 U.S.C. section 360bbb-3(b)(1), unless the authorization is terminated or revoked.  Performed at [facility], [place] [address] [place], [place] [address]   Protime-INR     Status: None   Collection Time: 08/24/22 10:29 PM  Result Value Ref Range   Prothrombin Time 13.5 11.4 - 15.2 seconds   INR 1.0 0.8 - 1.2    Comment: (NOTE) INR goal varies based on device and disease states. Performed at [facility], [place] [address], [place], [place] [address]   APTT     Status: None   Collection Time: 08/24/22 10:29 PM  Result Value Ref Range   aPTT 33 24 - 36 seconds    Comment: Performed at [facility], [place] [address] [place], [place] [address]  HIV Antibody (routine testing w rflx)     Status: None   Collection Time: 08/25/22  4:35 AM  Result Value Ref Range   HIV Screen 4th Generation wRfx Non Reactive Non Reactive    Comment: Performed at [facility], [place] [address], [place], [place] [id number]  Basic metabolic panel     Status: Abnormal   Collection Time: 08/25/22  4:35 AM  Result Value Ref Range   Sodium 135 135 - 145 mmol/L   Potassium 3.7 3.5 - 5.1 mmol/L   Chloride 103 98 - 111 mmol/L   CO2 24 22 - 32 mmol/L   Glucose, Bld 116 (H) 70 - 99 mg/dL    Comment: Glucose reference range applies only to samples taken after fasting for at least 8 hours.   BUN 20 6 - 20 mg/dL   Creatinine, Ser 1.21 0.61 - 1.24 mg/dL   Calcium 7.8 (L) 8.9 - 10.3 mg/dL   GFR, Estimated >60 >60 mL/min    Comment: (NOTE) Calculated using the CKD-EPI Creatinine Equation (2021)    Anion gap 8 5 - 15  Comment: Performed at [facility], [place] [address] [place], [place] [address]  Magnesium     Status: None   Collection Time: 08/25/22  4:35 AM  Result Value Ref Range    Magnesium 1.9 1.7 - 2.4 mg/dL    Comment: Performed at [facility], [place] [address], [place], [place] [address]  Phosphorus     Status: Abnormal   Collection Time: 08/25/22  4:35 AM  Result Value Ref Range   Phosphorus 4.7 (H) 2.5 - 4.6 mg/dL    Comment: Performed at [facility], [place] [address], [place], [place] [address]  CBC     Status: Abnormal   Collection Time: 08/25/22  4:35 AM  Result Value Ref Range   WBC 12.2 (H) 4.0 - 10.5 K/uL   RBC 4.39 4.22 - 5.81 MIL/uL   Hemoglobin 12.7 (L) 13.0 - 17.0 g/dL   HCT 37.9 (L) 39.0 - 52.0 %   MCV 86.3 80.0 - 100.0 fL   MCH 28.9 26.0 - 34.0 pg   MCHC 33.5 30.0 - 36.0 g/dL   RDW 13.3 11.5 - 15.5 %   Platelets 212 150 - 400 K/uL   nRBC 0.0 0.0 - 0.2 %    Comment: Performed at [facility], [place] [address], [place], [place] [address]    DG Knee 1-2 Views Right  Result Date: 08/25/2022 CLINICAL DATA:  Pain, hard time moving EXAM: RIGHT KNEE - 1-2 VIEW COMPARISON:  None Available. FINDINGS: Osseous mineralization normal. Joint spaces preserved. Small patellar enthesophyte at quadriceps tendon insertion. No acute fracture, dislocation, or bone destruction. No joint effusion. IMPRESSION: No significant osseous abnormalities. Electronically Signed   By: Lavonia Dana M.D.   On: 08/25/2022 10:22   DG Knee 1-2 Views Left  Result Date: 08/25/2022 CLINICAL DATA:  Encounter for pain, hard time moving EXAM: LEFT KNEE - 1-2 VIEW COMPARISON:  None Available. FINDINGS: Osseous mineralization normal. Moderate joint effusion. Medial compartment joint space narrowing. Small patellar enthesophyte at quadriceps tendon insertion. No acute fracture, dislocation, or bone destruction. IMPRESSION: Mild degenerative changes LEFT knee with moderate joint effusion. No acute osseous findings. Electronically Signed   By: Lavonia Dana M.D.   On: 08/25/2022 10:22   CT Abdomen Pelvis W Contrast  Result Date:  08/24/2022 CLINICAL DATA:  Abdominal pain. EXAM: CT ABDOMEN AND PELVIS WITH CONTRAST TECHNIQUE: Multidetector CT imaging of the abdomen and pelvis was performed using the standard protocol following bolus administration of intravenous contrast. RADIATION DOSE REDUCTION: This exam was performed according to the departmental dose-optimization program which includes automated exposure control, adjustment of the mA and/or kV according to patient size and/or use of iterative reconstruction technique. CONTRAST:  180m OMNIPAQUE IOHEXOL 300 MG/ML  SOLN COMPARISON:  CT abdomen pelvis dated 06/23/2022. FINDINGS: Lower chest: The visualized lung bases are clear. No intra-abdominal free air or free fluid. Hepatobiliary: There is fatty infiltration of the liver. Indeterminate 2 cm hypodense lesion in the left lobe of the liver appears to have some peripheral enhancement. This is not characterized on this CT but may represent a hemangioma. Further characterization with MRI without and with contrast on a nonemergent/outpatient basis recommended. No biliary dilatation. The gallbladder is unremarkable. Pancreas: Unremarkable. No pancreatic ductal dilatation or surrounding inflammatory changes. Spleen: Normal in size without focal abnormality. Adrenals/Urinary Tract: The adrenal glands unremarkable. There is no hydronephrosis on either side. There is symmetric enhancement and excretion of contrast by both kidneys. Small right renal upper pole  parapelvic cyst. There is mild fullness of the distal left ureter. The right ureter is unremarkable. The urinary bladder is decompressed around a Foley catheter. There is thickened appearance of the bladder wall with perivesical stranding concerning for cystitis. Correlation with urinalysis recommended. Stomach/Bowel: There is moderate stool throughout the colon. There is no bowel obstruction or active inflammation. The appendix is normal. Vascular/Lymphatic: The abdominal aorta and IVC are  unremarkable. No portal venous gas. There is no adenopathy. Reproductive: The prostate and seminal vesicles are grossly unremarkable. No pelvic mass. Other: None Musculoskeletal: Mild degenerative changes. There is right L5 pars defect. No listhesis. No acute osseous pathology. IMPRESSION: 1. Findings concerning for cystitis. Correlation with urinalysis recommended. 2. No bowel obstruction. Normal appendix. 3. Fatty liver. 4. Indeterminate 2 cm hypodense lesion in the left lobe of the liver. Further characterization with MRI without and with contrast on a nonemergent/outpatient basis recommended. Electronically Signed   By: Anner Crete M.D.   On: 08/24/2022 23:39   DG Chest Port 1 View  Result Date: 08/24/2022 CLINICAL DATA:  Fever, leg swelling, and weakness. EXAM: PORTABLE CHEST 1 VIEW COMPARISON:  09/18/2019 FINDINGS: The heart size and mediastinal contours are within normal limits. Both lungs are clear. The visualized skeletal structures are unremarkable. IMPRESSION: No active disease. Electronically Signed   By: Lucienne Capers M.D.   On: 08/24/2022 21:50    Review of Systems  Constitutional:  Positive for fever.  Genitourinary:  Positive for difficulty urinating.  Musculoskeletal:  Positive for arthralgias.   Blood pressure (!) 154/79, pulse 76, temperature 99.6 F (37.6 C), temperature source Oral, resp. rate 18, height 6' 2"$  (1.88 m), weight 115.7 kg, SpO2 99 %. Physical Exam Vitals reviewed.  HENT:     Head: Normocephalic.     Nose: Nose normal.     Mouth/Throat:     Mouth: Mucous membranes are moist.  Eyes:     Pupils: Pupils are equal, round, and reactive to light.  Cardiovascular:     Rate and Rhythm: Normal rate.     Pulses: Normal pulses.  Pulmonary:     Effort: Pulmonary effort is normal.  Abdominal:     General: Abdomen is flat.  Musculoskeletal:     Cervical back: Normal range of motion.  Skin:    General: Skin is warm.     Capillary Refill: Capillary refill  takes less than 2 seconds.  Neurological:     General: No focal deficit present.     Mental Status: He is alert.  Psychiatric:        Mood and Affect: Mood normal.   Bilateral knee examination demonstrates large effusion in the left knee.  Mild effusion in the right knee.  Pedal pulses palpable.  No proximal lymphadenopathy.  Patient is able to bend both knees.  Extensor mechanism intact bilaterally.  Collateral and cruciate ligaments stable. Assessment/Plan: Impression is bilateral knee effusion left worse than right.  Patient does have history of gout.  No history of trauma.  Both knees are aspirated today.  We took out about 120 cc from the left.  Looked like gout fluid.  On the right-hand side we took out about 20 cc.  Slightly more blood-tinged but also likely gout.  Neither aspirate had any cloudiness to it.  Cortisone injected due to high likelihood of gout diagnosis.  Radiographs unremarkable and showed no acute trauma or significant arthritis.  Plan is to send that fluid for laboratory analysis including cell count crystals aerobic and anaerobic  culture and Gram stain.  Okay for weightbearing as tolerated.  Okay to eat tonight.   Procedure note  After calling a timeout both knees were sterilely prepped with alcohol and Betadine.  Both knees were then numbed in the superior lateral aspect of the knee with 5 cc of lidocaine.  Both knees were then aspirated with a 18-gauge needle.  Aspirated about 120 cc from the left knee.  20 cc from the right knee.  Both these were sent to laboratory for further analysis.  The aspirate was not cloudy.  Looks like gout or pseudogout crystals.  Band-Aids and Ace wrap applied to both knees. Land[place] Dyke Suzanna Zahn 08/25/2022, 6:04 PM

## 2022-08-25 NOTE — Sepsis Progress Note (Signed)
Elink monitoring for the code sepsis protocol.  

## 2022-08-26 LAB — COMPREHENSIVE METABOLIC PANEL
ALT: 29 U/L (ref 0–44)
AST: 27 U/L (ref 15–41)
Albumin: 3.3 g/dL — ABNORMAL LOW (ref 3.5–5.0)
Alkaline Phosphatase: 74 U/L (ref 38–126)
Anion gap: 8 (ref 5–15)
BUN: 13 mg/dL (ref 6–20)
CO2: 24 mmol/L (ref 22–32)
Calcium: 8.4 mg/dL — ABNORMAL LOW (ref 8.9–10.3)
Chloride: 103 mmol/L (ref 98–111)
Creatinine, Ser: 0.98 mg/dL (ref 0.61–1.24)
GFR, Estimated: >60 mL/min (ref >60)
Glucose, Bld: 164 mg/dL — ABNORMAL HIGH (ref 70–99)
Potassium: 4.1 mmol/L (ref 3.5–5.1)
Sodium: 135 mmol/L (ref 135–145)
Total Bilirubin: 1.2 mg/dL (ref 0.3–1.2)
Total Protein: 6.5 g/dL (ref 6.5–8.1)

## 2022-08-26 LAB — CBC WITH DIFFERENTIAL/PLATELET
Abs Immature Granulocytes: 0.22 10*3/uL — ABNORMAL HIGH (ref 0.00–0.07)
Basophils Absolute: 0 10*3/uL (ref 0.0–0.1)
Basophils Relative: 0 %
Eosinophils Absolute: 0 10*3/uL (ref 0.0–0.5)
Eosinophils Relative: 0 %
HCT: 40.2 % (ref 39.0–52.0)
Hemoglobin: 13.3 g/dL (ref 13.0–17.0)
Immature Granulocytes: 2 %
Lymphocytes Relative: 5 %
Lymphs Abs: 0.6 10*3/uL — ABNORMAL LOW (ref 0.7–4.0)
MCH: 28.5 pg (ref 26.0–34.0)
MCHC: 33.1 g/dL (ref 30.0–36.0)
MCV: 86.1 fL (ref 80.0–100.0)
Monocytes Absolute: 0.6 10*3/uL (ref 0.1–1.0)
Monocytes Relative: 5 %
Neutro Abs: 9.9 10*3/uL — ABNORMAL HIGH (ref 1.7–7.7)
Neutrophils Relative %: 88 %
Platelets: 207 10*3/uL (ref 150–400)
RBC: 4.67 MIL/uL (ref 4.22–5.81)
RDW: 13 % (ref 11.5–15.5)
WBC: 11.2 10*3/uL — ABNORMAL HIGH (ref 4.0–10.5)
nRBC: 0 % (ref 0.0–0.2)

## 2022-08-26 LAB — URINE CULTURE: Culture: NO GROWTH

## 2022-08-26 LAB — RETICULOCYTES
Immature Retic Fract: 11.5 % (ref 2.3–15.9)
RBC.: 4.63 MIL/uL (ref 4.22–5.81)
Retic Count, Absolute: 52.3 10*3/uL (ref 19.0–186.0)
Retic Ct Pct: 1.1 % (ref 0.4–3.1)

## 2022-08-26 LAB — FERRITIN: Ferritin: 190 ng/mL (ref 24–336)

## 2022-08-26 LAB — IRON AND TIBC
Iron: 18 ug/dL — ABNORMAL LOW (ref 45–182)
Saturation Ratios: 8 % — ABNORMAL LOW (ref 17.9–39.5)
TIBC: 230 ug/dL — ABNORMAL LOW (ref 250–450)
UIBC: 212 ug/dL

## 2022-08-26 LAB — FOLATE: Folate: 10.8 ng/mL (ref >5.9)

## 2022-08-26 LAB — PHOSPHORUS: Phosphorus: 4.5 mg/dL (ref 2.5–4.6)

## 2022-08-26 LAB — URIC ACID: Uric Acid, Serum: 5.3 mg/dL (ref 3.7–8.6)

## 2022-08-26 LAB — VITAMIN B12: Vitamin B-12: 123 pg/mL — ABNORMAL LOW (ref 180–914)

## 2022-08-26 LAB — MAGNESIUM: Magnesium: 2.2 mg/dL (ref 1.7–2.4)

## 2022-08-26 MED ORDER — VITAMIN B-12 1000 MCG PO TABS
1000.0000 ug | ORAL_TABLET | Freq: Every day | ORAL | Status: DC
  Administered 2022-08-27: 1000 ug via ORAL
  Filled 2022-08-26: qty 1

## 2022-08-26 MED ORDER — CYANOCOBALAMIN 1000 MCG/ML IJ SOLN
1000.0000 ug | Freq: Once | INTRAMUSCULAR | Status: AC
  Administered 2022-08-26: 1000 ug via INTRAMUSCULAR
  Filled 2022-08-26: qty 1

## 2022-08-26 MED ORDER — CIPROFLOXACIN HCL 500 MG PO TABS
500.0000 mg | ORAL_TABLET | Freq: Two times a day (BID) | ORAL | Status: DC
  Administered 2022-08-26 – 2022-08-27 (×3): 500 mg via ORAL
  Filled 2022-08-26 (×3): qty 1

## 2022-08-26 MED ORDER — VITAMIN B-12 1000 MCG PO TABS: 1000.0000 ug | ORAL_TABLET | Freq: Every day | ORAL | Status: DC

## 2022-08-26 MED ORDER — POLYSACCHARIDE IRON COMPLEX 150 MG PO CAPS
150.0000 mg | ORAL_CAPSULE | Freq: Every day | ORAL | Status: DC
  Administered 2022-08-26: 150 mg via ORAL
  Filled 2022-08-26 (×2): qty 1

## 2022-08-26 NOTE — Progress Notes (Addendum)
Left and right knee aspirate with no crystals low white count and no organisms.  Clinically did not appear infected Plan is observation at this time.  No further workup indicated unless patient has recurrent pain and effusion and inability to weight-bear.  Patient is on antibiotics so cultures unlikely to grow anything. Follow-up in our clinic in 2 weeks for recheck

## 2022-08-26 NOTE — Progress Notes (Signed)
PROGRESS NOTE    Timothy Villarreal  [id number] DOB: 03-04-1971 DOA: 08/24/2022 PCP: [name], NP   Brief Narrative:  The patient is a 52 year old male with a past medical history significant for but not limited to depression, anxiety, hypothyroidism, sleep apnea, ADHD, osteoarthritis, BPH with lower urinary tract symptoms status post robotic water jet ablation of his prostate on 08/22/2022 as well as other comorbidities presented to the ED with fever, fatigue, low back pain and bladder spasms. He was reportedly started on ciprofloxacin roughly a week ago for his gross hematuria and bladder spasms and stopped taking the medication after his recent prostate ablation but states that he contacted urology clinic and was told to resume ciprofloxacin based on some test results to have. Began experiencing fever yesterday with increased bladder spasm and low back pain and fatigue. Symptoms worsened throughout the course of the day and he has been experiencing increased chronic bilateral knee pain but was more severe. On arrival to the ED he had a CT of the abdomen pelvis which was notable for urinary bladder wall thickening with perivesicular stranding and a CT was notable for 2 cm hypodense lesion in the left liver lobe concerning for hemangioma but recommended further outpatient MRI for characterization. Blood cultures and urine culture obtained and he was given 1 L normal saline and placed on IV cefepime, morphine and Zofran.  Cefepime was then de-escalated to p.o. ciprofloxacin and will attempt follow-up void if okay with urology today.  Anticipating discharging home in the next 24 to 40 hours given that he was febrile yesterday evening   Assessment and Plan:  Staph epidermidis UTI  - Fever and leukocytosis noted in ED and concerning for early sepsis though BP is stable and lactate normal; he had a temperature of 100.6 overnight we will watch him at least for 24 hours -WBC trend: Recent Labs   Lab 08/09/22 1118 08/24/22 2155 08/25/22 0435 08/26/22 0413  WBC 8.3 13.2* 12.2* 11.2*  - Blood and urine cultures collected in ED and antibiotics started and urine culture showed no growth - Treat with cefepime for now, follow cultures and clinical course -I received a message from the urologist Dr. Gloriann Loan who showed me his sensitivities of his recent urinary tract infection.  There is currently as below:   Iso/Result : 01 Staphylococcus epidermidis  Antimicrobic/Dose MIC SYSTEMIC URINE  ---------------------------------------------------------------  Ciprofloxacin <1 S S  Daptomycin <0.5 S S  Nitrofurantoin <32 S  Gentamicin >8 R R  Levofloxacin <1 S S  Linezolid 2 S S  Oxacillin >2 R R  Penicillin >8 R R  Rifampin <1 S S  Trimeth/Sulfa >2/38 R R  Tetracycline >8 R R  Vancomycin 2 S S    -These results were from 08/18/2022.  Current blood cultures and urine culture here are pending but urine culture showed no growth; blood cultures x 2 show NGTD at 1 Day  -Urinalysis here showed hazy appearance with large hemoglobin, large leukocytes, rare bacteria, present mucus, greater than 50 RBCs per high-power field, greater than 50 WBCs -Urology recommending to do a trial of void in the a.m. and recommending continuing IV antibiotics while awaiting final urine culture but this showed No Growth; Will de-escalate Abx to Po Ciprofloxacin  -Patient was having significant pain so we will give him IV ketorolac for his knee pain and get bilateral x-rays but this is improved -Will discuss with Urology and likely D/C tomorrow     AKI  - SCr is  1.52 on admission, up form 1.00 on February 6th  - No hydronephrosis on CT in ED  - Likely mild prerenal azotemia in setting of acute illness with loss of appetite  - Continue IVF hydration -BUN/Cr Trend: Recent Labs  Lab 08/09/22 1118 08/24/22 2155 08/25/22 0435 08/26/22 0413  BUN 14 24* 20 13  CREATININE 1.00 1.52* 1.21 0.98  -Avoid  Nephrotoxic Medications but will give a dose of IV ketorolac x 1, Contrast Dyes, Hypotension and Dehydration to Ensure Adequate Renal Perfusion and will need to Renally Adjust Meds -Continue to Monitor and Trend Renal Function carefully and repeat CMP in the AM  -Will discuss with urology about trial of void   Hypothyroidism  - Continue desiccated thyroid     OSA  - Continue CPAP qHS     Depression  - Continue SNRI     Bilateral knee pain with Left Knee Effusion -Left knee x-ray done and showed "Mild degenerative changes LEFT knee with moderate joint effusion. No acute osseous findings." -Right Knee X-Ray done and showed "No significant osseous abnormalities." -Will give him a dose of IV ketorolac 30 mg x 1 -Orthopedic surgery Dr. Marlou Sa was notified and he evaluated the patient and he underwent bilateral knee aspiration with 120 cc aspirated from the left knee and 20 cc from the right knee -Fluid Analysis was sent and showed yellow color with hazy appearance no crystals seen and white blood cell count synovial fluid was 2640 with 61 neutrophils, 9 lymphocytes, 30 monocytes/macrophages - Specimen Description SYNOVIAL LEFT KNEE Performed at [facility], [place] [address], [place], [place] [address]  Special Requests Normal Performed at [facility], [place] [address] [place], [place] [address]  Gram Stain RARE WBC PRESENT, PREDOMINANTLY PMN NO ORGANISMS SEEN  Culture NO GROWTH < 12 HOURS Performed at [place] [address], [place], [place] [address]      Component 1 d ago  Specimen Description SYNOVIAL RIGHT KNEE Performed at [place] [address] [place], [place] [address]  Special Requests Normal Performed at [facility], [place] [address] [place], [place] [address]  Gram Stain NO WBC SEEN NO ORGANISMS SEEN  Culture NO GROWTH < 12 HOURS Performed at [facility], [place] [address], [place], [place] [address]    Liver lesion  -Noted incidentally on CT in ED  -Discussed with patient, outpatient MRI recommended     Normocytic Anemia -Hgb/Hct Trend: Recent Labs  Lab 08/09/22 1118 08/23/22 0522 08/24/22 2155 08/25/22 0435 08/26/22 0413  HGB 14.9 13.4 13.8 12.7* 13.3  HCT 44.7 40.4 41.3 37.9* 40.2  MCV 86.5  --  86.4 86.3 86.1  -Checked Anemia Panel showed an iron level of 18, UIBC of 212, TIBC of 230, saturation ratios of 8%, ferritin level of 190, folate of 10.8, vitamin B12 of 123 -Will start supplementation with p.o. Niferex 7 give him an injection of IM B12 today and then p.o. B12 tomorrow -Continue to Monitor for S/Sx of Bleeding: no overt bleeding noted -Repeat CBC in the AM    Hypoalbuminemia -Patient's Albumin Trend: Recent Labs  Lab 08/24/22 2155 08/26/22 0413  ALBUMIN 3.7 3.3*  -Continue to Monitor and Trend and repeat CMP in the AM  Obesity -Complicates overall prognosis and care -Estimated body mass index is 32.75 kg/m as calculated from the following:   Height as of this encounter: '6\' 2"'$  (1.88 m).   Weight as of this encounter:  115.7 kg.  -Weight Loss and Dietary Counseling given  DVT prophylaxis: enoxaparin (LOVENOX) injection 40 mg Start: 08/25/22 1000    Code Status: Full Code Family Communication: No family currently at bedside  Disposition Plan:  Level of care: Med-Surg Status is: Inpatient Remains inpatient appropriate because: Patient was febrile overnight and will ensure that he is afebrile prior to discharge and will continue antibiotics but change him to p.o. to see how he tolerates and make sure that his WBC fever does not worsen.  Will also call urology to discuss trial of void for the patient   Consultants:  Urology Orthopedic surgery  Procedures:  As delineated as above  Antimicrobials:  Anti-infectives (From admission, onward)    Start     Dose/Rate Route Frequency Ordered Stop   08/26/22 1230  ciprofloxacin  (CIPRO) tablet 500 mg        500 mg Oral 2 times daily 08/26/22 1138     08/25/22 0400  ceFEPIme (MAXIPIME) 2 g in sodium chloride 0.9 % 100 mL IVPB  Status:  Discontinued        2 g 200 mL/hr over 30 Minutes Intravenous Every 8 hours 08/25/22 0246 08/26/22 1137   08/25/22 0145  ceFEPIme (MAXIPIME) 2 g in sodium chloride 0.9 % 100 mL IVPB  Status:  Discontinued        2 g 200 mL/hr over 30 Minutes Intravenous  Once 08/25/22 0131 08/25/22 0246   08/24/22 2300  cefTRIAXone (ROCEPHIN) 1 g in sodium chloride 0.9 % 100 mL IVPB        1 g 200 mL/hr over 30 Minutes Intravenous  Once 08/24/22 2256 08/25/22 0053       Subjective: Seen and examined at bedside and he thinks he is doing much better and was ambulating the halls without issues.  Had a low-grade temperature of 100.6 last night.  No nausea or vomiting.  No other concerns or complaints this time.   Objective: Vitals:   08/25/22 1323 08/25/22 1953 08/26/22 0517 08/26/22 1312  BP: (!) 154/79 (!) 154/87 126/83 132/80  Pulse: 76 79 62 76  Resp: '18 18 16 14  '$ Temp: 99.6 F (37.6 C) (!) 100.6 F (38.1 C) 97.9 F (36.6 C) 98.3 F (36.8 C)  TempSrc: Oral Oral Oral Oral  SpO2: 99% 98% 97% 98%  Weight:      Height:        Intake/Output Summary (Last 24 hours) at 08/26/2022 1548 Last data filed at 08/26/2022 1400 Gross per 24 hour  Intake 1240 ml  Output 4500 ml  Net -3260 ml   Filed Weights   08/25/22 0225  Weight: 115.7 kg   Examination: Physical Exam:  Constitutional: WN/WD obese Caucasian male in no acute distress Respiratory: Diminished to auscultation bilaterally with coarse breath sounds, no wheezing, rales, rhonchi or crackles. Normal respiratory effort and patient is not tachypenic. No accessory muscle use.  Unlabored breathing Cardiovascular: RRR, no murmurs / rubs / gallops. S1 and S2 auscultated. No extremity edema. Abdomen: Soft, non-tender, distended secondary to body habitus.  Bowel sounds positive.  GU: Deferred.   Foley catheter is in place Musculoskeletal: No clubbing / cyanosis of digits/nails. No joint deformity upper and lower extremities.  Skin: No rashes, lesions, ulcers limited skin evaluation. No induration; Warm and dry.  Neurologic: CN 2-12 grossly intact with no focal deficits. Romberg sign and cerebellar reflexes not assessed.  Psychiatric: Normal judgment and insight. Alert and oriented x 3. Normal mood and appropriate affect.  Data Reviewed: I have personally reviewed following labs and imaging studies  CBC: Recent Labs  Lab 08/23/22 0522 08/24/22 2155 08/25/22 0435 08/26/22 0413  WBC  --  13.2* 12.2* 11.2*  NEUTROABS  --  10.2*  --  9.9*  HGB 13.4 13.8 12.7* 13.3  HCT 40.4 41.3 37.9* 40.2  MCV  --  86.4 86.3 86.1  PLT  --  234 212 [id number]   Basic Metabolic Panel: Recent Labs  Lab 08/24/22 2155 08/25/22 0435 08/26/22 0413  NA 137 135 135  K 3.5 3.7 4.1  CL 103 103 103  CO2 '24 24 24  '$ GLUCOSE 120* 116* 164*  BUN 24* 20 13  CREATININE 1.52* 1.21 0.98  CALCIUM 8.4* 7.8* 8.4*  MG  --  1.9 2.2  PHOS  --  4.7* 4.5   GFR: Estimated Creatinine Clearance: 116.5 mL/min (by C-G formula based on SCr of 0.98 mg/dL). Liver Function Tests: Recent Labs  Lab 08/24/22 2155 08/26/22 0413  AST 13* 27  ALT 13 29  ALKPHOS 63 74  BILITOT 0.8 1.2  PROT 6.1* 6.5  ALBUMIN 3.7 3.3*   No results for input(s): "LIPASE", "AMYLASE" in the last 168 hours. No results for input(s): "AMMONIA" in the last 168 hours. Coagulation Profile: Recent Labs  Lab 08/24/22 2229  INR 1.0   Cardiac Enzymes: No results for input(s): "CKTOTAL", "CKMB", "CKMBINDEX", "TROPONINI" in the last 168 hours. BNP (last 3 results) No results for input(s): "PROBNP" in the last 8760 hours. HbA1C: No results for input(s): "HGBA1C" in the last 72 hours. CBG: No results for input(s): "GLUCAP" in the last 168 hours. Lipid Profile: No results for input(s): "CHOL", "HDL", "LDLCALC", "TRIG", "CHOLHDL", "LDLDIRECT"  in the last 72 hours. Thyroid Function Tests: No results for input(s): "TSH", "T4TOTAL", "FREET4", "T3FREE", "THYROIDAB" in the last 72 hours. Anemia Panel: Recent Labs    08/26/22 0413  VITAMINB12 123*  FOLATE 10.8  FERRITIN 190  TIBC 230*  IRON 18*  RETICCTPCT 1.1   Sepsis Labs: Recent Labs  Lab 08/24/22 2155  LATICACIDVEN 1.3    Recent Results (from the past 240 hour(s))  Culture, blood (routine x 2)     Status: None (Preliminary result)   Collection Time: 08/24/22  9:53 PM   Specimen: BLOOD  Result Value Ref Range Status   Specimen Description   Final    BLOOD RIGHT ANTECUBITAL Performed at [facility], [place] [address], [place], [place] [address]    Special Requests   Final    BOTTLES DRAWN AEROBIC AND ANAEROBIC Blood Culture results may not be optimal due to an excessive volume of blood received in culture bottles Performed at [place] [address], [place], [place] [address]    Culture   Final    NO GROWTH 1 DAY Performed at [facility], [place] [address], [place], [place] [address]    Report Status PENDING  Incomplete  Culture, blood (routine x 2)     Status: None (Preliminary result)   Collection Time: 08/24/22  9:55 PM   Specimen: BLOOD  Result Value Ref Range Status   Specimen Description   Final    BLOOD BLOOD LEFT FOREARM Performed at [place] [address], [place], [place] [address]    Special Requests   Final    BOTTLES DRAWN AEROBIC AND ANAEROBIC Blood Culture results may not be optimal due to an excessive volume of blood received in culture bottles Performed at [facility]  [place] [address], [place], [place] [address]    Culture   Final    NO GROWTH 1 DAY Performed at [facility], [place] [address], [place], [place] [address]    Report Status PENDING  Incomplete  Resp panel by RT-PCR (RSV, Flu A&B, Covid)     Status: None   Collection Time: 08/24/22   9:55 PM   Specimen: Nasal Swab  Result Value Ref Range Status   SARS Coronavirus 2 by RT PCR NEGATIVE NEGATIVE Final    Comment: (NOTE) SARS-CoV-2 target nucleic acids are NOT DETECTED.  The SARS-CoV-2 RNA is generally detectable in upper respiratory specimens during the acute phase of infection. The lowest concentration of SARS-CoV-2 viral copies this assay can detect is 138 copies/mL. A negative result does not preclude SARS-Cov-2 infection and should not be used as the sole basis for treatment or other patient management decisions. A negative result may occur with  improper specimen collection/handling, submission of specimen other than nasopharyngeal swab, presence of viral mutation(s) within the areas targeted by this assay, and inadequate number of viral copies(<138 copies/mL). A negative result must be combined with clinical observations, patient history, and epidemiological information. The expected result is Negative.  Fact Sheet for Patients:  EntrepreneurPulse.com.au  Fact Sheet for Healthcare Providers:  IncredibleEmployment.be  This test is no t yet approved or cleared by the [place] FDA and  has been authorized for detection and/or diagnosis of SARS-CoV-2 by FDA under an Emergency Use Authorization (EUA). This EUA will remain  in effect (meaning this test can be used) for the duration of the COVID-19 declaration under Section 564(b)(1) of the Act, 21 U.S.C.section 360bbb-3(b)(1), unless the authorization is terminated  or revoked sooner.       Influenza A by PCR NEGATIVE NEGATIVE Final   Influenza B by PCR NEGATIVE NEGATIVE Final    Comment: (NOTE) The Xpert Xpress SARS-CoV-2/FLU/RSV plus assay is intended as an aid in the diagnosis of influenza from Nasopharyngeal swab specimens and should not be used as a sole basis for treatment. Nasal washings and aspirates are unacceptable for Xpert Xpress  SARS-CoV-2/FLU/RSV testing.  Fact Sheet for Patients: EntrepreneurPulse.com.au  Fact Sheet for Healthcare Providers: IncredibleEmployment.be  This test is not yet approved or cleared by the [place] FDA and has been authorized for detection and/or diagnosis of SARS-CoV-2 by FDA under an Emergency Use Authorization (EUA). This EUA will remain in effect (meaning this test can be used) for the duration of the COVID-19 declaration under Section 564(b)(1) of the Act, 21 U.S.C. section 360bbb-3(b)(1), unless the authorization is terminated or revoked.     Resp Syncytial Virus by PCR NEGATIVE NEGATIVE Final    Comment: (NOTE) Fact Sheet for Patients: EntrepreneurPulse.com.au  Fact Sheet for Healthcare Providers: IncredibleEmployment.be  This test is not yet approved or cleared by the [place] FDA and has been authorized for detection and/or diagnosis of SARS-CoV-2 by FDA under an Emergency Use Authorization (EUA). This EUA will remain in effect (meaning this test can be used) for the duration of the COVID-19 declaration under Section 564(b)(1) of the Act, 21 U.S.C. section 360bbb-3(b)(1), unless the authorization is terminated or revoked.  Performed at [facility], [place] [address] [place], [place] [address]   Urine Culture (for pregnant, neutropenic or urologic patients or patients with an indwelling urinary catheter)     Status: None   Collection Time: 08/24/22 11:21 PM   Specimen: Urine, Catheterized  Result Value Ref  Range Status   Specimen Description   Final    URINE, CATHETERIZED Performed at [place] [address], [place], [place] [address]    Special Requests   Final    NONE Performed at [facility], [place] [address], [place], [place] [address]    Culture   Final    NO GROWTH Performed at [facility], [place]  [address], [place], [place] [address]    Report Status 08/26/2022 FINAL  Final  Body fluid culture w Gram Stain     Status: None (Preliminary result)   Collection Time: 08/25/22  6:10 PM   Specimen: Synovium; Synovial Fluid  Result Value Ref Range Status   Specimen Description   Final    SYNOVIAL LEFT KNEE Performed at [place] [address], [place], [place] [address]    Special Requests   Final    Normal Performed at [facility], [place] [address], [place], [place] [address]    Gram Stain   Final    RARE WBC PRESENT, PREDOMINANTLY PMN NO ORGANISMS SEEN    Culture   Final    NO GROWTH < 12 HOURS Performed at [place] [address] [place], [place] [address]    Report Status PENDING  Incomplete  Body fluid culture w Gram Stain     Status: None (Preliminary result)   Collection Time: 08/25/22  6:13 PM   Specimen: Synovium; Synovial Fluid  Result Value Ref Range Status   Specimen Description   Final    SYNOVIAL RIGHT KNEE Performed at [place] [address], [place], [place] [address]    Special Requests   Final    Normal Performed at [facility], [place] [address], [place], [place] [address]    Gram Stain NO WBC SEEN NO ORGANISMS SEEN   Final   Culture   Final    NO GROWTH < 12 HOURS Performed at [facility], [place] [address] [place],  [address]    Report Status PENDING  Incomplete    Radiology Studies: DG Knee 1-2 Views Right  Result Date: 08/25/2022 CLINICAL DATA:  Pain, hard time moving EXAM: RIGHT KNEE - 1-2 VIEW COMPARISON:  None Available. FINDINGS: Osseous mineralization normal. Joint spaces preserved. Small patellar enthesophyte at quadriceps tendon insertion. No acute fracture, dislocation, or bone destruction. No joint effusion. IMPRESSION: No significant osseous abnormalities. Electronically Signed   By: Lavonia Dana M.D.   On: 08/25/2022 10:22   DG Knee 1-2  Views Left  Result Date: 08/25/2022 CLINICAL DATA:  Encounter for pain, hard time moving EXAM: LEFT KNEE - 1-2 VIEW COMPARISON:  None Available. FINDINGS: Osseous mineralization normal. Moderate joint effusion. Medial compartment joint space narrowing. Small patellar enthesophyte at quadriceps tendon insertion. No acute fracture, dislocation, or bone destruction. IMPRESSION: Mild degenerative changes LEFT knee with moderate joint effusion. No acute osseous findings. Electronically Signed   By: Lavonia Dana M.D.   On: 08/25/2022 10:22   CT Abdomen Pelvis W Contrast  Result Date: 08/24/2022 CLINICAL DATA:  Abdominal pain. EXAM: CT ABDOMEN AND PELVIS WITH CONTRAST TECHNIQUE: Multidetector CT imaging of the abdomen and pelvis was performed using the standard protocol following bolus administration of intravenous contrast. RADIATION DOSE REDUCTION: This exam was performed according to the departmental dose-optimization program which includes automated exposure control, adjustment of the mA and/or kV according to patient size and/or use of iterative reconstruction technique.  CONTRAST:  123m OMNIPAQUE IOHEXOL 300 MG/ML  SOLN COMPARISON:  CT abdomen pelvis dated 06/23/2022. FINDINGS: Lower chest: The visualized lung bases are clear. No intra-abdominal free air or free fluid. Hepatobiliary: There is fatty infiltration of the liver. Indeterminate 2 cm hypodense lesion in the left lobe of the liver appears to have some peripheral enhancement. This is not characterized on this CT but may represent a hemangioma. Further characterization with MRI without and with contrast on a nonemergent/outpatient basis recommended. No biliary dilatation. The gallbladder is unremarkable. Pancreas: Unremarkable. No pancreatic ductal dilatation or surrounding inflammatory changes. Spleen: Normal in size without focal abnormality. Adrenals/Urinary Tract: The adrenal glands unremarkable. There is no hydronephrosis on either side. There is  symmetric enhancement and excretion of contrast by both kidneys. Small right renal upper pole parapelvic cyst. There is mild fullness of the distal left ureter. The right ureter is unremarkable. The urinary bladder is decompressed around a Foley catheter. There is thickened appearance of the bladder wall with perivesical stranding concerning for cystitis. Correlation with urinalysis recommended. Stomach/Bowel: There is moderate stool throughout the colon. There is no bowel obstruction or active inflammation. The appendix is normal. Vascular/Lymphatic: The abdominal aorta and IVC are unremarkable. No portal venous gas. There is no adenopathy. Reproductive: The prostate and seminal vesicles are grossly unremarkable. No pelvic mass. Other: None Musculoskeletal: Mild degenerative changes. There is right L5 pars defect. No listhesis. No acute osseous pathology. IMPRESSION: 1. Findings concerning for cystitis. Correlation with urinalysis recommended. 2. No bowel obstruction. Normal appendix. 3. Fatty liver. 4. Indeterminate 2 cm hypodense lesion in the left lobe of the liver. Further characterization with MRI without and with contrast on a nonemergent/outpatient basis recommended. Electronically Signed   By: AAnner CreteM.D.   On: 08/24/2022 23:39   DG Chest Port 1 View  Result Date: 08/24/2022 CLINICAL DATA:  Fever, leg swelling, and weakness. EXAM: PORTABLE CHEST 1 VIEW COMPARISON:  09/18/2019 FINDINGS: The heart size and mediastinal contours are within normal limits. Both lungs are clear. The visualized skeletal structures are unremarkable. IMPRESSION: No active disease. Electronically Signed   By: WLucienne CapersM.D.   On: 08/24/2022 21:50    Scheduled Meds:  Chlorhexidine Gluconate Cloth  6 each Topical Daily   ciprofloxacin  500 mg Oral BID   diclofenac Sodium  2 g Topical QID   enoxaparin (LOVENOX) injection  40 mg Subcutaneous Q24H   iron polysaccharides  150 mg Oral Daily   tamsulosin  0.4 mg  Oral QPC breakfast   thyroid  120 mg Oral Q0600   venlafaxine XR  150 mg Oral QHS   Continuous Infusions:   LOS: 1 day   ORaiford Noble DO [facility] Available via Epic secure chat 7am-7pm After these hours, please refer to coverage provider listed on [email or web address] 08/26/2022, 3:48 PM

## 2022-08-27 DIAGNOSIS — D72829 Elevated white blood cell count, unspecified: Secondary | ICD-10-CM

## 2022-08-27 LAB — CBC WITH DIFFERENTIAL/PLATELET
Abs Immature Granulocytes: 0.1 10*3/uL — ABNORMAL HIGH (ref 0.00–0.07)
Abs Immature Granulocytes: 0.07 10*3/uL (ref 0.00–0.07)
Basophils Absolute: 0 10*3/uL (ref 0.0–0.1)
Basophils Absolute: 0 10*3/uL (ref 0.0–0.1)
Basophils Relative: 0 %
Basophils Relative: 0 %
Eosinophils Absolute: 0.1 10*3/uL (ref 0.0–0.5)
Eosinophils Absolute: 0.1 10*3/uL (ref 0.0–0.5)
Eosinophils Relative: 1 %
Eosinophils Relative: 1 %
HCT: 38.7 % — ABNORMAL LOW (ref 39.0–52.0)
HCT: 38.6 % — ABNORMAL LOW (ref 39.0–52.0)
Hemoglobin: 13 g/dL (ref 13.0–17.0)
Hemoglobin: 13.1 g/dL (ref 13.0–17.0)
Immature Granulocytes: 1 %
Immature Granulocytes: 1 %
Lymphocytes Relative: 8 %
Lymphocytes Relative: 10 %
Lymphs Abs: 1.2 10*3/uL (ref 0.7–4.0)
Lymphs Abs: 1.5 10*3/uL (ref 0.7–4.0)
MCH: 28.5 pg (ref 26.0–34.0)
MCH: 29.2 pg (ref 26.0–34.0)
MCHC: 33.6 g/dL (ref 30.0–36.0)
MCHC: 33.9 g/dL (ref 30.0–36.0)
MCV: 84.9 fL (ref 80.0–100.0)
MCV: 86 fL (ref 80.0–100.0)
Monocytes Absolute: 1.2 10*3/uL — ABNORMAL HIGH (ref 0.1–1.0)
Monocytes Absolute: 1 10*3/uL (ref 0.1–1.0)
Monocytes Relative: 8 %
Monocytes Relative: 7 %
Neutro Abs: 12.9 10*3/uL — ABNORMAL HIGH (ref 1.7–7.7)
Neutro Abs: 12.1 10*3/uL — ABNORMAL HIGH (ref 1.7–7.7)
Neutrophils Relative %: 82 %
Neutrophils Relative %: 81 %
Platelets: 280 10*3/uL (ref 150–400)
Platelets: 275 10*3/uL (ref 150–400)
RBC: 4.56 MIL/uL (ref 4.22–5.81)
RBC: 4.49 MIL/uL (ref 4.22–5.81)
RDW: 13.1 % (ref 11.5–15.5)
RDW: 13.1 % (ref 11.5–15.5)
WBC: 15.5 10*3/uL — ABNORMAL HIGH (ref 4.0–10.5)
WBC: 14.9 10*3/uL — ABNORMAL HIGH (ref 4.0–10.5)
nRBC: 0 % (ref 0.0–0.2)
nRBC: 0 % (ref 0.0–0.2)

## 2022-08-27 LAB — COMPREHENSIVE METABOLIC PANEL
ALT: 35 U/L (ref 0–44)
AST: 28 U/L (ref 15–41)
Albumin: 3.8 g/dL (ref 3.5–5.0)
Alkaline Phosphatase: 74 U/L (ref 38–126)
Anion gap: 10 (ref 5–15)
BUN: 21 mg/dL — ABNORMAL HIGH (ref 6–20)
CO2: 26 mmol/L (ref 22–32)
Calcium: 8.7 mg/dL — ABNORMAL LOW (ref 8.9–10.3)
Chloride: 101 mmol/L (ref 98–111)
Creatinine, Ser: 0.96 mg/dL (ref 0.61–1.24)
GFR, Estimated: >60 mL/min (ref >60)
Glucose, Bld: 114 mg/dL — ABNORMAL HIGH (ref 70–99)
Potassium: 3.6 mmol/L (ref 3.5–5.1)
Sodium: 137 mmol/L (ref 135–145)
Total Bilirubin: 0.6 mg/dL (ref 0.3–1.2)
Total Protein: 6.8 g/dL (ref 6.5–8.1)

## 2022-08-27 LAB — PHOSPHORUS: Phosphorus: 4.1 mg/dL (ref 2.5–4.6)

## 2022-08-27 LAB — MAGNESIUM: Magnesium: 2.6 mg/dL — ABNORMAL HIGH (ref 1.7–2.4)

## 2022-08-27 MED ORDER — ONDANSETRON HCL 4 MG PO TABS: 4.0000 mg | ORAL_TABLET | Freq: Four times a day (QID) | ORAL | 0 refills | Status: AC | PRN

## 2022-08-27 MED ORDER — POLYSACCHARIDE IRON COMPLEX 150 MG PO CAPS: 150.0000 mg | ORAL_CAPSULE | Freq: Every day | ORAL | 0 refills | Status: AC

## 2022-08-27 MED ORDER — ACETAMINOPHEN 325 MG PO TABS: 650.0000 mg | ORAL_TABLET | Freq: Four times a day (QID) | ORAL | 0 refills | Status: AC | PRN

## 2022-08-27 MED ORDER — CYANOCOBALAMIN 1000 MCG PO TABS: 1000.0000 ug | ORAL_TABLET | Freq: Every day | ORAL | 0 refills | Status: AC

## 2022-08-27 MED ORDER — POLYETHYLENE GLYCOL 3350 17 G PO PACK: 17.0000 g | PACK | Freq: Every day | ORAL | 0 refills | Status: AC | PRN

## 2022-08-27 MED ORDER — DICLOFENAC SODIUM 1 % EX GEL: 2.0000 g | Freq: Four times a day (QID) | CUTANEOUS | 0 refills | Status: AC | PRN

## 2022-08-27 NOTE — Plan of Care (Addendum)
Patient   getting ready for discharge. No  s/s of distress noted. Denies pain but just some discomfort. Voiding and ambulating well independently.  Discharge instruction explained to patient and verbalized understanding. All personal belongings taken with patient. Awaiting  ride to come at this time.

## 2022-08-27 NOTE — Progress Notes (Signed)
Patient foley catheter discontinued this morning. Patient been voiding well since then. First void 250 ml pink-red urine, no clots. PVR 88.  Voided a total of 1000 ml clear yellow urine @ 130 pm. PVR 19. Denies pain in urination. Also denies pain on rt knee, some soreness on left knee. No pain meds given as of this time since the beginning of the shift. Patient ambulating independently in hallways,room and bathroom.

## 2022-08-27 NOTE — Discharge Summary (Incomplete)
Physician Discharge Summary   Patient: Timothy Villarreal MRN: [id number] DOB: [birth year]  Admit date:     08/24/2022  Discharge date: 08/27/2022  Discharge Physician: Raiford Noble, DO   PCP: [name], NP   Recommendations at discharge:  {Tip this will not be part of the note when signed- Example include specific recommendations for outpatient follow-up, pending tests to follow-up on. (Optional):26781}  ***  Discharge Diagnoses: Principal Problem:   Sepsis secondary to UTI [facility]) Active Problems:   Hypothyroidism   Depression   AKI (acute kidney injury) ([place])   Sleep apnea   Liver lesion, left lobe  Resolved Problems:   * No resolved hospital problems. [facility] Course: No notes on file  Assessment and Plan: No notes have been filed under this hospital service. Service: Hospitalist     {Tip this will not be part of the note when signed Body mass index is 32.75 kg/m. , ,  (Optional):26781}  {(NOTE) Pain control PDMP Statment (Optional):26782} Consultants: *** Procedures performed: ***  Disposition: {Plan; Disposition:26390} Diet recommendation:  Discharge Diet Orders (From admission, onward)     Start     Ordered   08/27/22 0000  Diet - low sodium heart healthy        08/27/22 1655           {Diet_Plan:26776} DISCHARGE MEDICATION: Allergies as of 08/27/2022   No Known Allergies      Medication List     STOP taking these medications    Aleve 220 MG tablet Generic drug: naproxen sodium   ciprofloxacin 500 MG tablet Commonly known as: CIPRO   levofloxacin 500 MG tablet Commonly known as: Levaquin       TAKE these medications    acetaminophen 325 MG tablet Commonly known as: TYLENOL Take 2 tablets (650 mg total) by mouth every 6 (six) hours as needed for mild pain (or Fever >/= 101).   Armour Thyroid 120 MG tablet Generic drug: thyroid Take 120 mg by mouth daily before breakfast.   cyanocobalamin 1000 MCG tablet Take 1  tablet (1,000 mcg total) by mouth daily. Start taking on: August 28, 2022   desvenlafaxine 100 MG 24 hr tablet Commonly known as: PRISTIQ Take 100 mg by mouth at bedtime.   diclofenac Sodium 1 % Gel Commonly known as: VOLTAREN Apply 2 g topically 4 (four) times daily as needed (Knee Pain).   HYDROcodone-acetaminophen 5-325 MG tablet Commonly known as: Norco Take 1 tablet by mouth every 4 (four) hours as needed for moderate pain.   iron polysaccharides 150 MG capsule Commonly known as: NIFEREX Take 1 capsule (150 mg total) by mouth daily. Start taking on: August 28, 2022   Jornay PM 100 MG Cp24 Generic drug: Methylphenidate HCl ER (PM) Take 100 mg by mouth at bedtime.   NON FORMULARY Take 1-2 tablets by mouth See admin instructions. CBD gummies- Chew 1-2 gummies by mouth at bedtime as needed for sleep   ondansetron 4 MG tablet Commonly known as: ZOFRAN Take 1 tablet (4 mg total) by mouth every 6 (six) hours as needed for nausea.   polyethylene glycol 17 g packet Commonly known as: MIRALAX / GLYCOLAX Take 17 g by mouth daily as needed for mild constipation.   PRESCRIPTION MEDICATION CPAP- At bedtime   tamsulosin 0.4 MG Caps capsule Commonly known as: FLOMAX Take 0.4 mg by mouth daily after breakfast.        Discharge Exam: Filed Weights   08/25/22 0225  Weight: 115.7  kg   ***  Condition at discharge: {DC Condition:26389}  The results of significant diagnostics from this hospitalization (including imaging, microbiology, ancillary and laboratory) are listed below for reference.   Imaging Studies: DG Knee 1-2 Views Right  Result Date: 08/25/2022 CLINICAL DATA:  Pain, hard time moving EXAM: RIGHT KNEE - 1-2 VIEW COMPARISON:  None Available. FINDINGS: Osseous mineralization normal. Joint spaces preserved. Small patellar enthesophyte at quadriceps tendon insertion. No acute fracture, dislocation, or bone destruction. No joint effusion. IMPRESSION: No  significant osseous abnormalities. Electronically Signed   By: Lavonia Dana M.D.   On: 08/25/2022 10:22   DG Knee 1-2 Views Left  Result Date: 08/25/2022 CLINICAL DATA:  Encounter for pain, hard time moving EXAM: LEFT KNEE - 1-2 VIEW COMPARISON:  None Available. FINDINGS: Osseous mineralization normal. Moderate joint effusion. Medial compartment joint space narrowing. Small patellar enthesophyte at quadriceps tendon insertion. No acute fracture, dislocation, or bone destruction. IMPRESSION: Mild degenerative changes LEFT knee with moderate joint effusion. No acute osseous findings. Electronically Signed   By: Lavonia Dana M.D.   On: 08/25/2022 10:22   CT Abdomen Pelvis W Contrast  Result Date: 08/24/2022 CLINICAL DATA:  Abdominal pain. EXAM: CT ABDOMEN AND PELVIS WITH CONTRAST TECHNIQUE: Multidetector CT imaging of the abdomen and pelvis was performed using the standard protocol following bolus administration of intravenous contrast. RADIATION DOSE REDUCTION: This exam was performed according to the departmental dose-optimization program which includes automated exposure control, adjustment of the mA and/or kV according to patient size and/or use of iterative reconstruction technique. CONTRAST:  169m OMNIPAQUE IOHEXOL 300 MG/ML  SOLN COMPARISON:  CT abdomen pelvis dated 06/23/2022. FINDINGS: Lower chest: The visualized lung bases are clear. No intra-abdominal free air or free fluid. Hepatobiliary: There is fatty infiltration of the liver. Indeterminate 2 cm hypodense lesion in the left lobe of the liver appears to have some peripheral enhancement. This is not characterized on this CT but may represent a hemangioma. Further characterization with MRI without and with contrast on a nonemergent/outpatient basis recommended. No biliary dilatation. The gallbladder is unremarkable. Pancreas: Unremarkable. No pancreatic ductal dilatation or surrounding inflammatory changes. Spleen: Normal in size without focal  abnormality. Adrenals/Urinary Tract: The adrenal glands unremarkable. There is no hydronephrosis on either side. There is symmetric enhancement and excretion of contrast by both kidneys. Small right renal upper pole parapelvic cyst. There is mild fullness of the distal left ureter. The right ureter is unremarkable. The urinary bladder is decompressed around a Foley catheter. There is thickened appearance of the bladder wall with perivesical stranding concerning for cystitis. Correlation with urinalysis recommended. Stomach/Bowel: There is moderate stool throughout the colon. There is no bowel obstruction or active inflammation. The appendix is normal. Vascular/Lymphatic: The abdominal aorta and IVC are unremarkable. No portal venous gas. There is no adenopathy. Reproductive: The prostate and seminal vesicles are grossly unremarkable. No pelvic mass. Other: None Musculoskeletal: Mild degenerative changes. There is right L5 pars defect. No listhesis. No acute osseous pathology. IMPRESSION: 1. Findings concerning for cystitis. Correlation with urinalysis recommended. 2. No bowel obstruction. Normal appendix. 3. Fatty liver. 4. Indeterminate 2 cm hypodense lesion in the left lobe of the liver. Further characterization with MRI without and with contrast on a nonemergent/outpatient basis recommended. Electronically Signed   By: AAnner CreteM.D.   On: 08/24/2022 23:39   DG Chest Port 1 View  Result Date: 08/24/2022 CLINICAL DATA:  Fever, leg swelling, and weakness. EXAM: PORTABLE CHEST 1 VIEW COMPARISON:  09/18/2019 FINDINGS: The  heart size and mediastinal contours are within normal limits. Both lungs are clear. The visualized skeletal structures are unremarkable. IMPRESSION: No active disease. Electronically Signed   By: Lucienne Capers M.D.   On: 08/24/2022 21:50    Microbiology: Results for orders placed or performed during the hospital encounter of 08/24/22  Culture, blood (routine x 2)     Status: None  (Preliminary result)   Collection Time: 08/24/22  9:53 PM   Specimen: BLOOD  Result Value Ref Range Status   Specimen Description   Final    BLOOD RIGHT ANTECUBITAL Performed at [facility] [address] [place], [place] [address]    Special Requests   Final    BOTTLES DRAWN AEROBIC AND ANAEROBIC Blood Culture results may not be optimal due to an excessive volume of blood received in culture bottles Performed at [facility] [address], [place], [place] [address]    Culture   Final    NO GROWTH 2 DAYS Performed at [facility] [address] [place], [place] [address]    Report Status PENDING  Incomplete  Culture, blood (routine x 2)     Status: None (Preliminary result)   Collection Time: 08/24/22  9:55 PM   Specimen: BLOOD  Result Value Ref Range Status   Specimen Description   Final    BLOOD BLOOD LEFT FOREARM Performed at [facility] [address], [place], [place] [address]    Special Requests   Final    BOTTLES DRAWN AEROBIC AND ANAEROBIC Blood Culture results may not be optimal due to an excessive volume of blood received in culture bottles Performed at [facility] [address], [place], [place] [address]    Culture   Final    NO GROWTH 2 DAYS Performed at [facility] [address] [place], [place] [address]    Report Status PENDING  Incomplete  Resp panel by RT-PCR (RSV, Flu A&B, Covid)     Status: None   Collection Time: 08/24/22  9:55 PM   Specimen: Nasal Swab  Result Value Ref Range Status   SARS Coronavirus 2 by RT PCR NEGATIVE NEGATIVE Final    Comment: (NOTE) SARS-CoV-2 target nucleic acids are NOT DETECTED.  The SARS-CoV-2 RNA is generally detectable in upper respiratory specimens during the acute phase of infection. The lowest concentration of SARS-CoV-2 viral copies this assay can detect is 138 copies/mL. A negative result does not preclude  SARS-Cov-2 infection and should not be used as the sole basis for treatment or other patient management decisions. A negative result may occur with  improper specimen collection/handling, submission of specimen other than nasopharyngeal swab, presence of viral mutation(s) within the areas targeted by this assay, and inadequate number of viral copies(<138 copies/mL). A negative result must be combined with clinical observations, patient history, and epidemiological information. The expected result is Negative.  Fact Sheet for Patients:  EntrepreneurPulse.com.au  Fact Sheet for Healthcare Providers:  IncredibleEmployment.be  This test is no t yet approved or cleared by the [place] FDA and  has been authorized for detection and/or diagnosis of SARS-CoV-2 by FDA under an Emergency Use Authorization (EUA). This EUA will remain  in effect (meaning this test can be used) for the duration of the COVID-19 declaration under Section 564(b)(1) of the Act, 21 U.S.C.section 360bbb-3(b)(1), unless the authorization is terminated  or revoked sooner.       Influenza A by PCR NEGATIVE  NEGATIVE Final   Influenza B by PCR NEGATIVE NEGATIVE Final    Comment: (NOTE) The Xpert Xpress SARS-CoV-2/FLU/RSV plus assay is intended as an aid in the diagnosis of influenza from Nasopharyngeal swab specimens and should not be used as a sole basis for treatment. Nasal washings and aspirates are unacceptable for Xpert Xpress SARS-CoV-2/FLU/RSV testing.  Fact Sheet for Patients: EntrepreneurPulse.com.au  Fact Sheet for Healthcare Providers: IncredibleEmployment.be  This test is not yet approved or cleared by the [place] FDA and has been authorized for detection and/or diagnosis of SARS-CoV-2 by FDA under an Emergency Use Authorization (EUA). This EUA will remain in effect (meaning this test can be used) for the duration of  the COVID-19 declaration under Section 564(b)(1) of the Act, 21 U.S.C. section 360bbb-3(b)(1), unless the authorization is terminated or revoked.     Resp Syncytial Virus by PCR NEGATIVE NEGATIVE Final    Comment: (NOTE) Fact Sheet for Patients: EntrepreneurPulse.com.au  Fact Sheet for Healthcare Providers: IncredibleEmployment.be  This test is not yet approved or cleared by the [place] FDA and has been authorized for detection and/or diagnosis of SARS-CoV-2 by FDA under an Emergency Use Authorization (EUA). This EUA will remain in effect (meaning this test can be used) for the duration of the COVID-19 declaration under Section 564(b)(1) of the Act, 21 U.S.C. section 360bbb-3(b)(1), unless the authorization is terminated or revoked.  Performed at [facility], [place] [address] [place], [place] [address]   Urine Culture (for pregnant, neutropenic or urologic patients or patients with an indwelling urinary catheter)     Status: None   Collection Time: 08/24/22 11:21 PM   Specimen: Urine, Catheterized  Result Value Ref Range Status   Specimen Description   Final    URINE, CATHETERIZED Performed at [facility] [address], [place], [place] [address]    Special Requests   Final    NONE Performed at [facility], [place] [address], [place], [place] [address]    Culture   Final    NO GROWTH Performed at [facility], [place] [address] [place], [place] [address]    Report Status 08/26/2022 FINAL  Final  Body fluid culture w Gram Stain     Status: None (Preliminary result)   Collection Time: 08/25/22  6:10 PM   Specimen: Synovium; Synovial Fluid  Result Value Ref Range Status   Specimen Description   Final    SYNOVIAL LEFT KNEE Performed at [facility] [address], [place], [place] [address]    Special Requests   Final    Normal Performed at [facility], [place] [address] [place], [place] [address]    Gram Stain   Final    RARE WBC PRESENT, PREDOMINANTLY PMN NO ORGANISMS SEEN    Culture   Final    NO GROWTH 2 DAYS Performed at [facility] [address], [place], [place] [address]    Report Status PENDING  Incomplete  Body fluid culture w Gram Stain     Status: None (Preliminary result)   Collection Time: 08/25/22  6:13 PM   Specimen: Synovium; Synovial Fluid  Result Value Ref Range Status   Specimen Description   Final    SYNOVIAL RIGHT KNEE Performed at [facility] [address], [place], [place] [address]    Special Requests   Final    Normal Performed at [facility], [place] [address]  [place], [place] [address]    Gram Stain NO WBC SEEN NO ORGANISMS SEEN   Final   Culture   Final    NO GROWTH 2 DAYS Performed at [facility], [place] [address] [place], [place] [address]    Report Status PENDING  Incomplete    Labs: CBC: Recent Labs  Lab 08/24/22 2155 08/25/22 0435 08/26/22 0413 08/27/22 0530 08/27/22 1401  WBC 13.2* 12.2* 11.2* 15.5* 14.9*  NEUTROABS 10.2*  --  9.9* 12.9* 12.1*  HGB 13.8 12.7* 13.3 13.0 13.1  HCT 41.3 37.9* 40.2 38.7* 38.6*  MCV 86.4 86.3 86.1 84.9 86.0  PLT 234 212 207 280 [id number]   Basic Metabolic Panel: Recent Labs  Lab 08/24/22 2155 08/25/22 0435 08/26/22 0413 08/27/22 0530  NA 137 135 135 137  K 3.5 3.7 4.1 3.6  CL 103 103 103 101  CO2 '24 24 24 26  '$ GLUCOSE 120* 116* 164* 114*  BUN 24* 20 13 21*  CREATININE 1.52* 1.21 0.98 0.96  CALCIUM 8.4* 7.8* 8.4* 8.7*  MG  --  1.9 2.2 2.6*  PHOS  --  4.7* 4.5 4.1   Liver Function Tests: Recent Labs  Lab 08/24/22 2155 08/26/22 0413 08/27/22 0530  AST 13* 27 28  ALT 13 29 35  ALKPHOS 63 74 74  BILITOT 0.8 1.2 0.6  PROT 6.1* 6.5 6.8  ALBUMIN 3.7 3.3* 3.8   CBG: No results for input(s): "GLUCAP" in the last 168 hours.  Discharge time spent: {LESS  THAN/GREATER [id number] 30 minutes.  Signed: Kerney Elbe, DO [facility] 08/27/2022

## 2022-08-29 ENCOUNTER — Telehealth: Payer: Self-pay

## 2022-08-29 LAB — BODY FLUID CULTURE W GRAM STAIN
Culture: NO GROWTH
Culture: NO GROWTH
Gram Stain: NONE SEEN
Special Requests: NORMAL
Special Requests: NORMAL

## 2022-08-29 NOTE — Progress Notes (Signed)
Hi Cecille Rubin can you have Ronalee Belts follow-up with [facility] Joiner sometime next week to make sure his knees have continued to improve.  I saw him in the hospital.  Thanks

## 2022-08-29 NOTE — Telephone Encounter (Signed)
-----   Message from Meredith Pel, MD sent at 08/29/2022 12:36 PM EST ----- Jonathon Bellows can you have Timothy Villarreal follow-up with [facility] sometime next week to make sure his knees have continued to improve.  I saw him in the hospital.  Thanks

## 2022-08-29 NOTE — Progress Notes (Signed)
Lauren :)

## 2022-08-29 NOTE — Telephone Encounter (Signed)
Per Dr Marlou Sa needs f/u appt from hospital next week with [facility] to check knees.  Can you please call and schedule?

## 2022-08-30 LAB — CULTURE, BLOOD (ROUTINE X 2)
Culture: NO GROWTH
Culture: NO GROWTH

## 2022-08-30 NOTE — Discharge Summary (Signed)
Physician Discharge Summary   Patient: Timothy Villarreal MRN: [id number] DOB: May 21, 1971  Admit date:     08/24/2022  Discharge date: 08/27/2022  Discharge Physician: Kerney Elbe   PCP: [name], NP   Recommendations at discharge:   Follow-up with PCP within 1 to 2 weeks and repeat CBC, CMP, mag, Phos within 1 week Follow-up with urology in the outpatient setting and infectious disease recommends no more antibiotics will monitor off of antibiotics  Follow-up with orthopedic surgery within the outpatient setting within 1 to 2 weeks Follow-up in outpatient setting with PCP for MRI of liver lesion  Discharge Diagnoses: Principal Problem:   Sepsis secondary to UTI [facility]) Active Problems:   Hypothyroidism   Depression   AKI (acute kidney injury) ([place])   Sleep apnea   Liver lesion, left lobe  Resolved Problems:   * No resolved hospital problems. [facility] Course: The patient is a 52 year old male with a past medical history significant for but not limited to depression, anxiety, hypothyroidism, sleep apnea, ADHD, osteoarthritis, BPH with lower urinary tract symptoms status post robotic water jet ablation of his prostate on 08/22/2022 as well as other comorbidities presented to the ED with fever, fatigue, low back pain and bladder spasms. He was reportedly started on ciprofloxacin roughly a week ago for his gross hematuria and bladder spasms and stopped taking the medication after his recent prostate ablation but states that he contacted urology clinic and was told to resume ciprofloxacin based on some test results to have. Began experiencing fever yesterday with increased bladder spasm and low back pain and fatigue. Symptoms worsened throughout the course of the day and he has been experiencing increased chronic bilateral knee pain but was more severe. On arrival to the ED he had a CT of the abdomen pelvis which was notable for urinary bladder wall thickening with perivesicular  stranding and a CT was notable for 2 cm hypodense lesion in the left liver lobe concerning for hemangioma but recommended further outpatient MRI for characterization. Blood cultures and urine culture obtained and he was given 1 L normal saline and placed on IV cefepime, morphine and Zofran.  Cefepime was then de-escalated to p.o. ciprofloxacin.  Urology recommended voiding trial to be done and he was able to successfully pass this.  Foley catheter was removed.  I spoke with ID who recommended no further antibiotics for this patient given his adequate treatment.  WBC had trended upwards but is now slowly trending back down.  Infectious disease recommend outpatient follow-up with PCP within 1 week and patient need to follow-up with urology as well as orthopedic surgeon outpatient setting.  He is medically stable for discharge and ambulating well without issues and improved significantly.  Assessment and Plan:  Staph epidermidis UTI  - Fever and leukocytosis noted in ED and concerning for early sepsis though BP is stable and lactate normal; he had a temperature of 100.6 overnight we will watch him at least for 24 hours -WBC trend: Recent Labs  Lab 08/09/22 1118 08/24/22 2155 08/25/22 0435 08/26/22 0413 08/27/22 0530 08/27/22 1401  WBC 8.3 13.2* 12.2* 11.2* 15.5* 14.9*  - Blood and urine cultures collected in ED and antibiotics started and urine culture showed no growth - Treat with cefepime for now, follow cultures and clinical course -I received a message from the urologist Dr. Gloriann Loan who showed me his sensitivities of his recent urinary tract infection.  There is currently as below:   Iso/Result : 01 Staphylococcus  epidermidis  Antimicrobic/Dose MIC SYSTEMIC URINE  ---------------------------------------------------------------  Ciprofloxacin <1 S S  Daptomycin <0.5 S S  Nitrofurantoin <32 S  Gentamicin >8 R R  Levofloxacin <1 S S  Linezolid 2 S S  Oxacillin >2 R R  Penicillin  >8 R R  Rifampin <1 S S  Trimeth/Sulfa >2/38 R R  Tetracycline >8 R R  Vancomycin 2 S S    -These results were from 08/18/2022.  Current blood cultures and urine culture here are pending but urine culture showed no growth; blood cultures x 2 show NGTD at 1 Day  -Urinalysis here showed hazy appearance with large hemoglobin, large leukocytes, rare bacteria, present mucus, greater than 50 RBCs per high-power field, greater than 50 WBCs -Urology recommending to do a trial of void in the a.m. and recommending continuing IV antibiotics while awaiting final urine culture but this showed No Growth; Will de-escalate Abx to Po Ciprofloxacin  -Patient was having significant pain so we will give him IV ketorolac for his knee pain and get bilateral x-rays but this is improved -After further discussion with urology he had a trial of void done and passed.  I also discussed the case with infectious diseases who recommended no further antibiotics given that they felt he was adequately treated and they recommend outpatient follow-up.  Patient WBC did trend up slightly but likely was reactive and started trending back down prior to discharge so he is stable for discharge and will need to follow-up with PCP and urology in outpatient setting.   AKI  - SCr is 1.52 on admission, up form 1.00 on February 6th  - No hydronephrosis on CT in ED  - Likely mild prerenal azotemia in setting of acute illness with loss of appetite  - Continue IVF hydration -BUN/Cr Trend: Recent Labs  Lab 08/09/22 1118 08/24/22 2155 08/25/22 0435 08/26/22 0413 08/27/22 0530  BUN 14 24* 20 13 21*  CREATININE 1.00 1.52* 1.21 0.98 0.96   -Avoid Nephrotoxic Medications but will give a dose of IV ketorolac x 1, Contrast Dyes, Hypotension and Dehydration to Ensure Adequate Renal Perfusion and will need to Renally Adjust Meds -Continue to Monitor and Trend Renal Function carefully and repeat CMP in the AM  -Will discuss with urology about  trial of void   Hypothyroidism  - Continue desiccated thyroid     OSA  - Continue CPAP qHS     Depression  - Continue SNRI     Bilateral knee pain with Left Knee Effusion -Left knee x-ray done and showed "Mild degenerative changes LEFT knee with moderate joint effusion. No acute osseous findings." -Right Knee X-Ray done and showed "No significant osseous abnormalities." -Will give him a dose of IV ketorolac 30 mg x 1 -Orthopedic surgery Dr. Marlou Sa was notified and he evaluated the patient and he underwent bilateral knee aspiration with 120 cc aspirated from the left knee and 20 cc from the right knee -Fluid Analysis was sent and showed yellow color with hazy appearance no crystals seen and white blood cell count synovial fluid was 2640 with 61 neutrophils, 9 lymphocytes, 30 monocytes/macrophages - Specimen Description SYNOVIAL LEFT KNEE Performed at [facility], [place] [address] [place], [place] [address]  Special Requests Normal Performed at [facility], [place] [address], [place], [place] [address]  Gram Stain RARE WBC PRESENT, PREDOMINANTLY PMN NO ORGANISMS SEEN  Culture NO GROWTH 3 DAYS Performed at [place] [address], [place], [place] [address]  Special Requests Normal Performed at [facility], [place] [address], [place], [place] [address]  Gram Stain NO WBC SEEN NO ORGANISMS SEEN  Culture NO GROWTH 3 DAYS Performed at [facility], [place] [address] [place], [place] [address]   -Doing well and Ortho recommends outpatient follow-up given that they do not feel that his knees are infected  Liver lesion  -Noted incidentally on CT in ED  -Discussed with patient, outpatient MRI recommended and patient to follow-up with PCP to get this arranged   Normocytic Anemia -Hgb/Hct Trend: Recent Labs  Lab 08/09/22 1118 08/23/22 0522 08/24/22 2155 08/25/22 0435 08/26/22 0413 08/27/22 0530 08/27/22 1401   HGB 14.9 13.4 13.8 12.7* 13.3 13.0 13.1  HCT 44.7 40.4 41.3 37.9* 40.2 38.7* 38.6*  MCV 86.5  --  86.4 86.3 86.1 84.9 86.0  -Checked Anemia Panel showed an iron level of 18, UIBC of 212, TIBC of 230, saturation ratios of 8%, ferritin level of 190, folate of 10.8, vitamin B12 of 123 -Will start supplementation with p.o. Niferex 7 give him an injection of IM B12 today and then p.o. B12 tomorrow -Continue to Monitor for S/Sx of Bleeding: no overt bleeding noted -Repeat CBC within 1 week   Hypoalbuminemia -Patient's Albumin Trend: Recent Labs  Lab 08/24/22 2155 08/26/22 0413 08/27/22 0530  ALBUMIN 3.7 3.3* 3.8  -Continue to Monitor and Trend and repeat CMP within 1 week   Obesity -Complicates overall prognosis and care -Estimated body mass index is 32.75 kg/m as calculated from the following:   Height as of this encounter: '6\' 2"'$  (1.88 m).   Weight as of this encounter: 115.7 kg.  -Weight Loss and Dietary Counseling given  Consultants: Urology, Orthopedic Surgery, Discussed with ID Dr. Laurice Record Procedures performed: As delineated as above  Disposition: Home Diet recommendation:  Discharge Diet Orders (From admission, onward)     Start     Ordered   08/27/22 0000  Diet - low sodium heart healthy        08/27/22 1655           Cardiac diet DISCHARGE MEDICATION: Allergies as of 08/27/2022   No Known Allergies      Medication List     STOP taking these medications    Aleve 220 MG tablet Generic drug: naproxen sodium   ciprofloxacin 500 MG tablet Commonly known as: CIPRO   levofloxacin 500 MG tablet Commonly known as: Levaquin       TAKE these medications    acetaminophen 325 MG tablet Commonly known as: TYLENOL Take 2 tablets (650 mg total) by mouth every 6 (six) hours as needed for mild pain (or Fever >/= 101).   Armour Thyroid 120 MG tablet Generic drug: thyroid Take 120 mg by mouth daily before breakfast.   cyanocobalamin 1000 MCG  tablet Take 1 tablet (1,000 mcg total) by mouth daily.   desvenlafaxine 100 MG 24 hr tablet Commonly known as: PRISTIQ Take 100 mg by mouth at bedtime.   diclofenac Sodium 1 % Gel Commonly known as: VOLTAREN Apply 2 g topically 4 (four) times daily as needed (Knee Pain).   HYDROcodone-acetaminophen 5-325 MG tablet Commonly known as: Norco Take 1 tablet by mouth every 4 (four) hours as needed for moderate pain.   iron polysaccharides 150 MG capsule Commonly known as: NIFEREX Take 1 capsule (150 mg total) by mouth daily.   Jornay PM 100 MG Cp24 Generic drug: Methylphenidate HCl ER (PM) Take 100 mg by mouth at  bedtime.   NON FORMULARY Take 1-2 tablets by mouth See admin instructions. CBD gummies- Chew 1-2 gummies by mouth at bedtime as needed for sleep   ondansetron 4 MG tablet Commonly known as: ZOFRAN Take 1 tablet (4 mg total) by mouth every 6 (six) hours as needed for nausea.   polyethylene glycol 17 g packet Commonly known as: MIRALAX / GLYCOLAX Take 17 g by mouth daily as needed for mild constipation.   PRESCRIPTION MEDICATION CPAP- At bedtime   tamsulosin 0.4 MG Caps capsule Commonly known as: FLOMAX Take 0.4 mg by mouth daily after breakfast.        Discharge Exam: Filed Weights   08/25/22 0225  Weight: 115.7 kg   Vitals:   08/27/22 0521 08/27/22 1318  BP: (!) 141/96 113/87  Pulse: (!) 59 73  Resp: 16 18  Temp: 97.8 F (36.6 C) 98.2 F (36.8 C)  SpO2: 98% 99%   Examination: Physical Exam:  Constitutional: WN/WD obese Caucasian male currently no acute distress appears calm Respiratory: Diminished to auscultation bilaterally, no wheezing, rales, rhonchi or crackles. Normal respiratory effort and patient is not tachypenic. No accessory muscle use.  Cardiovascular: RRR, no murmurs / rubs / gallops. S1 and S2 auscultated. No extremity edema. Abdomen: Soft, non-tender, distended secondary to body habitus. Bowel sounds positive.  GU:  Deferred. Musculoskeletal: No clubbing / cyanosis of digits/nails. No joint deformity upper and lower extremities.  Skin: No rashes, lesions, ulcers limited skin evaluation. No induration; Warm and dry.  Neurologic: CN 2-12 grossly intact with no focal deficits. Romberg sign and cerebellar reflexes not assessed.  Psychiatric: Normal judgment and insight. Alert and oriented x 3. Normal mood and appropriate affect.   Condition at discharge: stable  The results of significant diagnostics from this hospitalization (including imaging, microbiology, ancillary and laboratory) are listed below for reference.   Imaging Studies: DG Knee 1-2 Views Right  Result Date: 08/25/2022 CLINICAL DATA:  Pain, hard time moving EXAM: RIGHT KNEE - 1-2 VIEW COMPARISON:  None Available. FINDINGS: Osseous mineralization normal. Joint spaces preserved. Small patellar enthesophyte at quadriceps tendon insertion. No acute fracture, dislocation, or bone destruction. No joint effusion. IMPRESSION: No significant osseous abnormalities. Electronically Signed   By: Lavonia Dana M.D.   On: 08/25/2022 10:22   DG Knee 1-2 Views Left  Result Date: 08/25/2022 CLINICAL DATA:  Encounter for pain, hard time moving EXAM: LEFT KNEE - 1-2 VIEW COMPARISON:  None Available. FINDINGS: Osseous mineralization normal. Moderate joint effusion. Medial compartment joint space narrowing. Small patellar enthesophyte at quadriceps tendon insertion. No acute fracture, dislocation, or bone destruction. IMPRESSION: Mild degenerative changes LEFT knee with moderate joint effusion. No acute osseous findings. Electronically Signed   By: Lavonia Dana M.D.   On: 08/25/2022 10:22   CT Abdomen Pelvis W Contrast  Result Date: 08/24/2022 CLINICAL DATA:  Abdominal pain. EXAM: CT ABDOMEN AND PELVIS WITH CONTRAST TECHNIQUE: Multidetector CT imaging of the abdomen and pelvis was performed using the standard protocol following bolus administration of intravenous  contrast. RADIATION DOSE REDUCTION: This exam was performed according to the departmental dose-optimization program which includes automated exposure control, adjustment of the mA and/or kV according to patient size and/or use of iterative reconstruction technique. CONTRAST:  139m OMNIPAQUE IOHEXOL 300 MG/ML  SOLN COMPARISON:  CT abdomen pelvis dated 06/23/2022. FINDINGS: Lower chest: The visualized lung bases are clear. No intra-abdominal free air or free fluid. Hepatobiliary: There is fatty infiltration of the liver. Indeterminate 2 cm hypodense lesion in the left  lobe of the liver appears to have some peripheral enhancement. This is not characterized on this CT but may represent a hemangioma. Further characterization with MRI without and with contrast on a nonemergent/outpatient basis recommended. No biliary dilatation. The gallbladder is unremarkable. Pancreas: Unremarkable. No pancreatic ductal dilatation or surrounding inflammatory changes. Spleen: Normal in size without focal abnormality. Adrenals/Urinary Tract: The adrenal glands unremarkable. There is no hydronephrosis on either side. There is symmetric enhancement and excretion of contrast by both kidneys. Small right renal upper pole parapelvic cyst. There is mild fullness of the distal left ureter. The right ureter is unremarkable. The urinary bladder is decompressed around a Foley catheter. There is thickened appearance of the bladder wall with perivesical stranding concerning for cystitis. Correlation with urinalysis recommended. Stomach/Bowel: There is moderate stool throughout the colon. There is no bowel obstruction or active inflammation. The appendix is normal. Vascular/Lymphatic: The abdominal aorta and IVC are unremarkable. No portal venous gas. There is no adenopathy. Reproductive: The prostate and seminal vesicles are grossly unremarkable. No pelvic mass. Other: None Musculoskeletal: Mild degenerative changes. There is right L5 pars defect.  No listhesis. No acute osseous pathology. IMPRESSION: 1. Findings concerning for cystitis. Correlation with urinalysis recommended. 2. No bowel obstruction. Normal appendix. 3. Fatty liver. 4. Indeterminate 2 cm hypodense lesion in the left lobe of the liver. Further characterization with MRI without and with contrast on a nonemergent/outpatient basis recommended. Electronically Signed   By: Anner Crete M.D.   On: 08/24/2022 23:39   DG Chest Port 1 View  Result Date: 08/24/2022 CLINICAL DATA:  Fever, leg swelling, and weakness. EXAM: PORTABLE CHEST 1 VIEW COMPARISON:  09/18/2019 FINDINGS: The heart size and mediastinal contours are within normal limits. Both lungs are clear. The visualized skeletal structures are unremarkable. IMPRESSION: No active disease. Electronically Signed   By: Lucienne Capers M.D.   On: 08/24/2022 21:50    Microbiology: Results for orders placed or performed during the hospital encounter of 08/24/22  Culture, blood (routine x 2)     Status: None   Collection Time: 08/24/22  9:53 PM   Specimen: BLOOD  Result Value Ref Range Status   Specimen Description   Final    BLOOD RIGHT ANTECUBITAL Performed at [place] [address] [place], [place] [address]    Special Requests   Final    BOTTLES DRAWN AEROBIC AND ANAEROBIC Blood Culture results may not be optimal due to an excessive volume of blood received in culture bottles Performed at [place] [address], [place], [place] [address]    Culture   Final    NO GROWTH 5 DAYS Performed at [facility], [place] [address], [place], [place] [address]    Report Status 08/30/2022 FINAL  Final  Culture, blood (routine x 2)     Status: None   Collection Time: 08/24/22  9:55 PM   Specimen: BLOOD  Result Value Ref Range Status   Specimen Description   Final    BLOOD BLOOD LEFT FOREARM Performed at [place] [address], [place], [place]  [address]    Special Requests   Final    BOTTLES DRAWN AEROBIC AND ANAEROBIC Blood Culture results may not be optimal due to an excessive volume of blood received in culture bottles Performed at [place] [address] [place],  [address]    Culture   Final    NO GROWTH 5 DAYS Performed at [facility], [place] [address], [place], [place] [address]    Report Status 08/30/2022 FINAL  Final  Resp panel by RT-PCR (RSV, Flu A&B, Covid)     Status: None   Collection Time: 08/24/22  9:55 PM   Specimen: Nasal Swab  Result Value Ref Range Status   SARS Coronavirus 2 by RT PCR NEGATIVE NEGATIVE Final    Comment: (NOTE) SARS-CoV-2 target nucleic acids are NOT DETECTED.  The SARS-CoV-2 RNA is generally detectable in upper respiratory specimens during the acute phase of infection. The lowest concentration of SARS-CoV-2 viral copies this assay can detect is 138 copies/mL. A negative result does not preclude SARS-Cov-2 infection and should not be used as the sole basis for treatment or other patient management decisions. A negative result may occur with  improper specimen collection/handling, submission of specimen other than nasopharyngeal swab, presence of viral mutation(s) within the areas targeted by this assay, and inadequate number of viral copies(<138 copies/mL). A negative result must be combined with clinical observations, patient history, and epidemiological information. The expected result is Negative.  Fact Sheet for Patients:  EntrepreneurPulse.com.au  Fact Sheet for Healthcare Providers:  IncredibleEmployment.be  This test is no t yet approved or cleared by the [place] FDA and  has been authorized for detection and/or diagnosis of SARS-CoV-2 by FDA under an Emergency Use Authorization (EUA). This EUA will remain  in effect (meaning this test can be used) for the duration of the COVID-19 declaration under  Section 564(b)(1) of the Act, 21 U.S.C.section 360bbb-3(b)(1), unless the authorization is terminated  or revoked sooner.       Influenza A by PCR NEGATIVE NEGATIVE Final   Influenza B by PCR NEGATIVE NEGATIVE Final    Comment: (NOTE) The Xpert Xpress SARS-CoV-2/FLU/RSV plus assay is intended as an aid in the diagnosis of influenza from Nasopharyngeal swab specimens and should not be used as a sole basis for treatment. Nasal washings and aspirates are unacceptable for Xpert Xpress SARS-CoV-2/FLU/RSV testing.  Fact Sheet for Patients: EntrepreneurPulse.com.au  Fact Sheet for Healthcare Providers: IncredibleEmployment.be  This test is not yet approved or cleared by the [place] FDA and has been authorized for detection and/or diagnosis of SARS-CoV-2 by FDA under an Emergency Use Authorization (EUA). This EUA will remain in effect (meaning this test can be used) for the duration of the COVID-19 declaration under Section 564(b)(1) of the Act, 21 U.S.C. section 360bbb-3(b)(1), unless the authorization is terminated or revoked.     Resp Syncytial Virus by PCR NEGATIVE NEGATIVE Final    Comment: (NOTE) Fact Sheet for Patients: EntrepreneurPulse.com.au  Fact Sheet for Healthcare Providers: IncredibleEmployment.be  This test is not yet approved or cleared by the [place] FDA and has been authorized for detection and/or diagnosis of SARS-CoV-2 by FDA under an Emergency Use Authorization (EUA). This EUA will remain in effect (meaning this test can be used) for the duration of the COVID-19 declaration under Section 564(b)(1) of the Act, 21 U.S.C. section 360bbb-3(b)(1), unless the authorization is terminated or revoked.  Performed at [facility], [place] [address] [place], [place] [address]   Urine Culture (for pregnant, neutropenic or urologic patients or patients with an indwelling  urinary catheter)     Status: None   Collection Time: 08/24/22 11:21 PM   Specimen: Urine, Catheterized  Result Value Ref Range Status   Specimen Description   Final    URINE, CATHETERIZED Performed at [place] [address] [place], [place]  [id number]    Special Requests   Final    NONE Performed at [facility], [place] [address], [place], [place] [address]    Culture   Final    NO GROWTH Performed at [facility], [place] [address] [place], [place] [address]    Report Status 08/26/2022 FINAL  Final  Body fluid culture w Gram Stain     Status: None   Collection Time: 08/25/22  6:10 PM   Specimen: Synovium; Synovial Fluid  Result Value Ref Range Status   Specimen Description   Final    SYNOVIAL LEFT KNEE Performed at [place] [address], [place], [place] [address]    Special Requests   Final    Normal Performed at [facility], [place] [address], [place], [place] [address]    Gram Stain   Final    RARE WBC PRESENT, PREDOMINANTLY PMN NO ORGANISMS SEEN    Culture   Final    NO GROWTH 3 DAYS Performed at [place] [address] [place], [place] [address]    Report Status 08/29/2022 FINAL  Final  Body fluid culture w Gram Stain     Status: None   Collection Time: 08/25/22  6:13 PM   Specimen: Synovium; Synovial Fluid  Result Value Ref Range Status   Specimen Description   Final    SYNOVIAL RIGHT KNEE Performed at [place] [address], [place], [place] [address]    Special Requests   Final    Normal Performed at [facility], [place] [address], [place], [place] [address]    Gram Stain NO WBC SEEN NO ORGANISMS SEEN   Final   Culture   Final    NO GROWTH 3 DAYS Performed at [facility], [place] [address] [place],  [address]    Report Status 08/29/2022 FINAL  Final   Labs: CBC: Recent Labs  Lab 08/24/22 2155  08/25/22 0435 08/26/22 0413 08/27/22 0530 08/27/22 1401  WBC 13.2* 12.2* 11.2* 15.5* 14.9*  NEUTROABS 10.2*  --  9.9* 12.9* 12.1*  HGB 13.8 12.7* 13.3 13.0 13.1  HCT 41.3 37.9* 40.2 38.7* 38.6*  MCV 86.4 86.3 86.1 84.9 86.0  PLT 234 212 207 280 [id number]   Basic Metabolic Panel: Recent Labs  Lab 08/24/22 2155 08/25/22 0435 08/26/22 0413 08/27/22 0530  NA 137 135 135 137  K 3.5 3.7 4.1 3.6  CL 103 103 103 101  CO2 '24 24 24 26  '$ GLUCOSE 120* 116* 164* 114*  BUN 24* 20 13 21*  CREATININE 1.52* 1.21 0.98 0.96  CALCIUM 8.4* 7.8* 8.4* 8.7*  MG  --  1.9 2.2 2.6*  PHOS  --  4.7* 4.5 4.1   Liver Function Tests: Recent Labs  Lab 08/24/22 2155 08/26/22 0413 08/27/22 0530  AST 13* 27 28  ALT 13 29 35  ALKPHOS 63 74 74  BILITOT 0.8 1.2 0.6  PROT 6.1* 6.5 6.8  ALBUMIN 3.7 3.3* 3.8   CBG: No results for input(s): "GLUCAP" in the last 168 hours.  Discharge time spent: greater than 30 minutes.  Signed: Raiford Noble, DO [facility] 08/30/2022

## 2022-09-05 ENCOUNTER — Ambulatory Visit (INDEPENDENT_AMBULATORY_CARE_PROVIDER_SITE_OTHER): Payer: BC Managed Care – PPO | Admitting: Physician Assistant

## 2022-09-05 ENCOUNTER — Encounter: Payer: Self-pay | Admitting: Physician Assistant

## 2022-09-05 VITALS — Ht 74.0 in | Wt 255.0 lb

## 2022-09-05 DIAGNOSIS — M25561 Pain in right knee: Secondary | ICD-10-CM | POA: Diagnosis not present

## 2022-09-05 DIAGNOSIS — M25462 Effusion, left knee: Secondary | ICD-10-CM | POA: Diagnosis not present

## 2022-09-05 DIAGNOSIS — M25461 Effusion, right knee: Secondary | ICD-10-CM

## 2022-09-05 DIAGNOSIS — M25562 Pain in left knee: Secondary | ICD-10-CM | POA: Diagnosis not present

## 2022-09-05 MED ORDER — METHYLPREDNISOLONE 4 MG PO TBPK: ORAL_TABLET | ORAL | 0 refills | Status: DC

## 2022-09-05 NOTE — Progress Notes (Unsigned)
Office Visit Note   Patient: Timothy Villarreal           Date of Birth: 09-22-1970           MRN: [id number] Visit Date: 09/05/2022              Requested by: Timothy Hose, NP [address] [place],  [place] [address] PCP: Timothy Hose, NP  Chief Complaint  Patient presents with  . Right Knee - Pain  . Left Knee - Pain  . Left Shoulder - Pain  . Left Ankle - Pain      HPI: [name] is a pleasant 52 year old gentleman who presents with a chief complaint of bilateral knee pain.  His history is somewhat complicated.  He has recently undergone prostate surgery.  Prior to his prostate surgery he did have a urinalysis..  After the prostate surgery was done he did develop sepsis.  He was hospitalized for this and discharged last week.  Was not discharged on any antibiotics.  While in the hospital Dr. Marlou Villarreal was consulted because of swelling and pain in both of his knees.  He aspirated the knees as the patient also has a history of gout.  The cultures were negative.  He did not have any crystals.  He was told to follow-up if pain occurred or in 1 to 2 weeks.  He did go to another provider who did not give him any significant information or relief.  He presents his wife here today because he has had recurrence of significant pain in his knees.  He also complains of swelling in his ankle and just polymyalgias.  Denies any current fever or chills  Assessment & Plan: Visit Diagnoses:  1. Acute pain of right knee     Plan: Patient was evaluated by Dr. Marlou Villarreal today.  He did go forward with an aspiration and injection of Toradol into his knees.  We will send these for Gram stain cultures and crystals.  In the meantime we will place him on a Medrol Dosepak.  He knows to contact us immediately if he has any fever or chills.  Will follow-up in 1 week.  Findings certainly a murky picture could still be gout.  No definite infection.  Follow-Up Instructions: 1 week  Ortho  Exam  Patient is alert, oriented, no adenopathy, well-dressed, normal affect, normal respiratory effort. Bilateral knees the left knee is warm with a significant effusion right knee also has moderate effusion.  No cellulitis on either knee.  Compartments are soft and nontender he is neurovascularly intact  Imaging: No results found. No images are attached to the encounter.  Labs: Lab Results  Component Value Date   LABURIC 5.3 08/26/2022   REPTSTATUS 08/29/2022 FINAL 08/25/2022   GRAMSTAIN NO WBC SEEN NO ORGANISMS SEEN  08/25/2022   CULT  08/25/2022    NO GROWTH 3 DAYS Performed at [facility], [place] [address], [place], [place] [address]      Lab Results  Component Value Date   ALBUMIN 3.8 08/27/2022   ALBUMIN 3.3 (L) 08/26/2022   ALBUMIN 3.7 08/24/2022    Lab Results  Component Value Date   MG 2.6 (H) 08/27/2022   MG 2.2 08/26/2022   MG 1.9 08/25/2022   No results found for: "VD25OH"  No results found for: "PREALBUMIN"    Latest Ref Rng & Units 08/27/2022    2:01 PM 08/27/2022    5:30 AM 08/26/2022  4:13 AM  CBC EXTENDED  WBC 4.0 - 10.5 K/uL 14.9  15.5  11.2   RBC 4.22 - 5.81 MIL/uL 4.49  4.56  4.67    4.63   Hemoglobin 13.0 - 17.0 g/dL 13.1  13.0  13.3   HCT 39.0 - 52.0 % 38.6  38.7  40.2   Platelets 150 - 400 K/uL 275  280  207   NEUT# 1.7 - 7.7 K/uL 12.1  12.9  9.9   Lymph# 0.7 - 4.0 K/uL 1.5  1.2  0.6      Body mass index is 32.74 kg/m.  Orders:  Orders Placed This Encounter  Procedures  . Anaerobic and Aerobic Culture  . Synovial Fluid Analysis, Complete   No orders of the defined types were placed in this encounter.    Procedures: Large Joint Inj: bilateral knee on 09/05/2022 4:50 PM Indications: pain and diagnostic evaluation Details: 25 G 1.5 in needle, superolateral approach  Arthrogram: No  Medications (Right): 5 mL lidocaine 1 % Aspirate (Right): 70 mL cloudy Medications (Left): 5 mL lidocaine 1 % Aspirate (Left): 100 mL  cloudy Outcome: tolerated well, no immediate complications  After verbal consent was obtained the superior lateral aspect of both of his knees was prepped with alcohol and Betadine.  5 cc of lidocaine was injected into each knee.  After adequate anesthesia 70 cc was aspirated from the right knee and he was injected with Toradol 30 mg.  On the left knee 100 cc of hazy colored fluid was also aspirated.  30 mg of Toradol was injected.  Band-Aids were placed.  Patient self ambulated from the clinic Procedure, treatment alternatives, risks and benefits explained, specific risks discussed. Consent was given by the patient.    Clinical Data: No additional findings.  ROS:  All other systems negative, except as noted in the HPI. Review of Systems  Objective: Vital Signs: Ht '6\' 2"'$  (1.88 m)   Wt 255 lb (115.7 kg)   BMI 32.74 kg/m   Specialty Comments:  No specialty comments available.  PMFS History: Patient Active Problem List   Diagnosis Date Noted  . Sepsis secondary to UTI ([place]) 08/25/2022  . Hypothyroidism 08/25/2022  . Depression 08/25/2022  . AKI (acute kidney injury) ([place]) 08/25/2022  . Sleep apnea 08/25/2022  . Liver lesion, left lobe 08/25/2022  . BPH (benign prostatic hyperplasia) 08/22/2022  . Pain in right knee 09/14/2018   Past Medical History:  Diagnosis Date  . ADHD (attention deficit hyperactivity disorder)   . Anxiety   . Arthritis   . Cancer [facility])    thyroid cancer  . Complication of anesthesia    nausea / vomit (over 25 yrs ago)  . Hypothyroidism    Hx thyroidectomy  2007  . Sleep apnea     No family history on file.  Past Surgical History:  Procedure Laterality Date  . EYE SURGERY     Lasik surgery  . THYROIDECTOMY  2007   Social History   Occupational History  . Not on file  Tobacco Use  . Smoking status: Never  . Smokeless tobacco: Never  Vaping Use  . Vaping Use: Never used  Substance and Sexual Activity  . Alcohol use: Not Currently  .  Drug use: Not Currently  . Sexual activity: Not Currently

## 2022-09-06 MED ORDER — LIDOCAINE HCL 1 % IJ SOLN
5.0000 mL | INTRAMUSCULAR | Status: AC | PRN
  Administered 2022-09-05: 5 mL

## 2022-09-11 LAB — SYNOVIAL FLUID ANALYSIS, COMPLETE
Basophils, %: 0 %
Basophils, %: 0 %
Eosinophils-Synovial: 0 % (ref 0–2)
Eosinophils-Synovial: 0 % (ref 0–2)
Lymphocytes-Synovial Fld: 28 % (ref 0–74)
Lymphocytes-Synovial Fld: 5 % (ref 0–74)
Monocyte/Macrophage: 0 % (ref 0–69)
Monocyte/Macrophage: 3 % (ref 0–69)
Neutrophil, Synovial: 72 % — ABNORMAL HIGH (ref 0–24)
Neutrophil, Synovial: 92 % — ABNORMAL HIGH (ref 0–24)
Synoviocytes, %: 0 % (ref 0–15)
Synoviocytes, %: 0 % (ref 0–15)
WBC, Synovial: 8615 cells/uL — ABNORMAL HIGH (ref <150)
WBC, Synovial: 7679 cells/uL — ABNORMAL HIGH (ref <150)

## 2022-09-11 LAB — ANAEROBIC AND AEROBIC CULTURE
AER RESULT:: NO GROWTH
AER RESULT:: NO GROWTH
MICRO NUMBER:: 14645123
MICRO NUMBER:: 14645124
MICRO NUMBER:: 14645117
MICRO NUMBER:: 14645118
SPECIMEN QUALITY:: ADEQUATE
SPECIMEN QUALITY:: ADEQUATE
SPECIMEN QUALITY:: ADEQUATE
SPECIMEN QUALITY:: ADEQUATE

## 2022-09-14 NOTE — Progress Notes (Signed)
Hi Lauren I called and left a message on the machine at this number.  In general he is getting need to get MRI scans on his knees if his fluid comes back.  Does not look like gout or infection.  Thanks

## 2022-09-15 ENCOUNTER — Telehealth: Payer: Self-pay

## 2022-09-15 NOTE — Telephone Encounter (Signed)
-----   Message from Meredith Pel, MD sent at 09/14/2022  5:34 PM EDT ----- Corrin Parker I called and left a message on the machine at this number.  In general he is getting need to get MRI scans on his knees if his fluid comes back.  Does not look like gout or infection.  Thanks

## 2022-09-15 NOTE — Telephone Encounter (Signed)
Can you caal him first thx

## 2022-09-15 NOTE — Telephone Encounter (Signed)
Do you want me to proceed with MRI?

## 2022-09-16 NOTE — Telephone Encounter (Signed)
Tried calling, significant other answered, provided a number for him. Tried calling that number as well (second number listed in chart) and received greeting not a working number. Can you please f/u on this?

## 2022-09-22 ENCOUNTER — Encounter: Payer: Self-pay | Admitting: Physician Assistant

## 2022-09-22 ENCOUNTER — Ambulatory Visit (INDEPENDENT_AMBULATORY_CARE_PROVIDER_SITE_OTHER): Payer: BC Managed Care – PPO | Admitting: Physician Assistant

## 2022-09-22 DIAGNOSIS — M25461 Effusion, right knee: Secondary | ICD-10-CM

## 2022-09-22 DIAGNOSIS — M25462 Effusion, left knee: Secondary | ICD-10-CM | POA: Diagnosis not present

## 2022-09-22 NOTE — Progress Notes (Signed)
Office Visit Note   Patient: Timothy Villarreal           Date of Birth: [birth year]           MRN: [id number] Visit Date: 09/22/2022              Requested by: [name], NP [address] [place],  [place] [address] PCP: [name], NP  Chief Complaint  Patient presents with   Right Knee - Follow-up   Left Knee - Follow-up      HPI: Mr. Silsby returns today for follow-up on his bilateral knees.  He is patient of Dr. Randel Pigg.  He has had recurrent effusions in his knees since he was hospitalized for sepsis.  He has had multiple aspirations which have been sent for evaluation but have been negative.  He says that his knees feel a little bit better but he feels like he is starting to have pain more on the right than the left.  Assessment & Plan: Visit Diagnoses:  1. Bilateral knee effusions     Plan: I reviewed notes and telephone calls from Dr. Marlou Sa.  I do think he is starting to accumulate fluid though declined aspiration today.  I told him because of this recurrent effusion and return in pain I would recommend following Dr. Randel Pigg recommendation to get bilateral MRIs of his knees and then follow-up with Dr. Marlou Sa.  He does not have any signs of infection today with any fever or chills.  Will continue to treat this symptomatically with ice and compression he can come back and get an aspiration if he desires  Follow-Up Instructions: Return With Dr. Marlou Sa after MRIs.   Ortho Exam  Patient is alert, oriented, no adenopathy, well-dressed, normal affect, normal respiratory effort. Bilateral knees mild warmth on the right greater than left.  No redness.  He has good flexion and extension.  He is quite stiff especially when he first starts walking.  He has good stability.  He does have bilateral effusions right greater than left though not as significant as a previous.  Imaging: No results found. No images are attached to the encounter.  Labs: Lab  Results  Component Value Date   LABURIC 5.3 08/26/2022   REPTSTATUS 08/29/2022 FINAL 08/25/2022   GRAMSTAIN NO WBC SEEN NO ORGANISMS SEEN  08/25/2022   CULT  08/25/2022    NO GROWTH 3 DAYS Performed at [facility], [place] [address] [place], [place] [address]      Lab Results  Component Value Date   ALBUMIN 3.8 08/27/2022   ALBUMIN 3.3 (L) 08/26/2022   ALBUMIN 3.7 08/24/2022    Lab Results  Component Value Date   MG 2.6 (H) 08/27/2022   MG 2.2 08/26/2022   MG 1.9 08/25/2022   No results found for: "VD25OH"  No results found for: "PREALBUMIN"    Latest Ref Rng & Units 08/27/2022    2:01 PM 08/27/2022    5:30 AM 08/26/2022    4:13 AM  CBC EXTENDED  WBC 4.0 - 10.5 K/uL 14.9  15.5  11.2   RBC 4.22 - 5.81 MIL/uL 4.49  4.56  4.67    4.63   Hemoglobin 13.0 - 17.0 g/dL 13.1  13.0  13.3   HCT 39.0 - 52.0 % 38.6  38.7  40.2   Platelets 150 - 400 K/uL 275  280  207   NEUT# 1.7 - 7.7 K/uL 12.1  12.9  9.9  Lymph# 0.7 - 4.0 K/uL 1.5  1.2  0.6      There is no height or weight on file to calculate BMI.  Orders:  Orders Placed This Encounter  Procedures   MR Knee Right w/o contrast   MR Knee Left w/o contrast   No orders of the defined types were placed in this encounter.    Procedures: No procedures performed  Clinical Data: No additional findings.  ROS:  All other systems negative, except as noted in the HPI. Review of Systems  Objective: Vital Signs: There were no vitals taken for this visit.  Specialty Comments:  No specialty comments available.  PMFS History: Patient Active Problem List   Diagnosis Date Noted   Sepsis secondary to UTI ([place]) 08/25/2022   Hypothyroidism 08/25/2022   Depression 08/25/2022   AKI (acute kidney injury) ([place]) 08/25/2022   Sleep apnea 08/25/2022   Liver lesion, left lobe 08/25/2022   BPH (benign prostatic hyperplasia) 08/22/2022   Pain in right knee 09/14/2018   Past Medical History:  Diagnosis Date   ADHD  (attention deficit hyperactivity disorder)    Anxiety    Arthritis    Cancer ([place])    thyroid cancer   Complication of anesthesia    nausea / vomit (over 25 yrs ago)   Hypothyroidism    Hx thyroidectomy  2007   Sleep apnea     No family history on file.  Past Surgical History:  Procedure Laterality Date   EYE SURGERY     Lasik surgery   THYROIDECTOMY  2007   Social History   Occupational History   Not on file  Tobacco Use   Smoking status: Never   Smokeless tobacco: Never  Vaping Use   Vaping Use: Never used  Substance and Sexual Activity   Alcohol use: Not Currently   Drug use: Not Currently   Sexual activity: Not Currently

## 2022-10-21 ENCOUNTER — Ambulatory Visit
Admission: RE | Admit: 2022-10-21 | Discharge: 2022-10-21 | Disposition: A | Discharge status: Home / Self Care | Payer: BC Managed Care – PPO | Source: Ambulatory Visit | Attending: Orthopedic Surgery | Admitting: Orthopedic Surgery

## 2022-10-21 DIAGNOSIS — M25462 Effusion, left knee: Secondary | ICD-10-CM

## 2022-10-24 ENCOUNTER — Ambulatory Visit: Payer: BC Managed Care – PPO | Admitting: Surgical

## 2022-10-24 DIAGNOSIS — M25461 Effusion, right knee: Secondary | ICD-10-CM

## 2022-10-24 DIAGNOSIS — M25462 Effusion, left knee: Secondary | ICD-10-CM

## 2022-10-25 ENCOUNTER — Encounter: Payer: Self-pay | Admitting: Surgical

## 2022-10-25 MED ORDER — LIDOCAINE HCL 1 % IJ SOLN
5.0000 mL | INTRAMUSCULAR | Status: AC | PRN
Start: 2022-10-24 — End: 2022-10-24
  Administered 2022-10-24: 5 mL

## 2022-10-25 MED ORDER — BUPIVACAINE HCL 0.25 % IJ SOLN
4.0000 mL | INTRAMUSCULAR | Status: AC | PRN
Start: 2022-10-24 — End: 2022-10-24
  Administered 2022-10-24: 4 mL via INTRA_ARTICULAR

## 2022-10-25 NOTE — Progress Notes (Signed)
Office Visit Note   Patient: Timothy Villarreal           Date of Birth: 1970-11-18           MRN: [id number] Visit Date: 10/24/2022 Requested by: Jettie Pagan, NP [address] [place],  [place] [address] PCP: Jettie Pagan, NP  Subjective: Chief Complaint  Patient presents with   Right Knee - Pain   Left Knee - Pain    HPI: Timothy Villarreal is a 52 y.o. male who presents to the office for MRI review.  Patient complains of bilateral knee pain.  Right knee bothers him more than his left but more recently his left knee is bothering him more than his right.  He states that this knee pain mostly began after hospitalization for sepsis which she has previously discussed with Dr. August Saucer and Timothy Villarreal.  States that overall his knee pain is trending in the right direction.  He had several different infusions at an IV clinic that he feels this helped him.  Most of his pain he localizes to the medial aspect of the knees.  He denies any fevers or chills and denies any waking with pain.  Takes occasional over-the-counter medications but overall does not like to take consistent medications.  Pain is worse with deep squatting and with kneeling on his knees.  Does have a history of prior right knee surgery back around 2010 that sounds like it was a microfracture procedure.  No prior history of surgery to the left knee.  No other joint pains bothering him currently.  MRI results revealed: MR Knee Left w/o contrast  Result Date: 10/24/2022 CLINICAL DATA:  Bilateral knee pain for 2 months EXAM: MRI OF THE LEFT KNEE WITHOUT CONTRAST TECHNIQUE: Multiplanar, multisequence MR imaging of the knee was performed. No intravenous contrast was administered. COMPARISON:  08/25/2022 FINDINGS: MENISCI Medial meniscus: Substantial degenerative signal in the midbody medial meniscus and adjacent anterior and posterior horn with grade 2 signal in this vicinity as well as mild free edge  irregularity in the midbody and posterior horn compatible with free edge fraying. A well-defined grade 3 tear is not observed. Lateral meniscus:  Unremarkable LIGAMENTS Cruciates:  Unremarkable Collaterals: Mild edema tracks adjacent to the MCL. This can be incidental but in the appropriate clinical circumstance could represent grade 1 sprain. CARTILAGE Patellofemoral: Mild chondral thinning and chondral heterogeneity along the medial and lateral patellar facets. Medial: Mild to moderate medial compartmental articular cartilage thinning especially medially. Underlying subcortical marrow edema medially in the medial tibial plateau on image 14 series 5, likely degenerative. Similar subcortical marrow edema posteriorly along the medial femoral condyle, image 26 series 7. Lateral:  Unremarkable Joint:  Moderate knee effusion with mild synovitis. Popliteal Fossa:  Trace pes anserine bursitis. Extensor Mechanism:  Unremarkable Bones: No significant extra-articular osseous abnormalities identified. Other: No supplemental non-categorized findings. IMPRESSION: 1. Degenerative signal in the medial meniscus with free edge fraying in the midbody and posterior horn but without a well-defined grade 3 tear. 2. Mild edema tracks adjacent to the MCL. This can be incidental but in the appropriate clinical circumstance could represent grade 1 sprain. 3. Moderate knee effusion with mild synovitis. 4. Trace pes anserine bursitis. 5. Mild to moderate degenerative chondral thinning in the medial compartment with mild chondral thinning along the medial and lateral patellar facets. Electronically Signed   By: Gaylyn Rong M.D.   On: 10/24/2022 15:08   MR Knee Right w/o  contrast  Result Date: 10/24/2022 CLINICAL DATA:  Bilateral knee pain for 2 months. Right knee arthroscopy in 2010. EXAM: MRI OF THE RIGHT KNEE WITHOUT CONTRAST TECHNIQUE: Multiplanar, multisequence MR imaging of the knee was performed. No intravenous contrast was  administered. COMPARISON:  Radiographs 08/25/2022 FINDINGS: MENISCI Medial meniscus: Horizontal signal in the midbody, and adjacent anterior and posterior horn medial meniscus extending to the free edge and inferior surface with flap like irregularity along the free edge of the anterior horn on image 16 of series 6. The degree of irregularity in this vicinity causes me to favor tear or recurrent tear over postoperative meniscal signal. Indistinct 1.1 by 0.7 by 1.5 cm parameniscal cyst anterior to the anterior root. Lateral meniscus:  Unremarkable LIGAMENTS Cruciates:  Unremarkable Collaterals: Proximal popliteus tendinopathy. Otherwise unremarkable. CARTILAGE Patellofemoral: Mild chondromalacia along the lateral patellar facet with associated heterogeneity in faint surface irregularity, image 14 series 3. Mild marginal spurring. Medial: Prominent chondral thinning medially. 1.0 by 2.3 cm non-fragmented osteochondral lesion along the medial femoral condyle, image 18 series 4 and image 22 series 8. 0.5 cm non-fragmented osteochondral lesion posteriorly along the medial tibial plateau. Marginal spurring. Lateral:  Marginal spurring. Joint:  Moderate to large knee effusion with mild synovitis. Popliteal Fossa:  Distal semimembranosus tendinopathy. Extensor Mechanism:  Unremarkable Bones: No significant extra-articular osseous abnormalities identified. Other: No supplemental non-categorized findings. IMPRESSION: 1. Torn midbody and adjacent anterior posterior horns medial meniscus. The anterior horn component has a small free edge flap. Indistinct 1.5 cm parameniscal cyst anterior to the anterior root. 2. Moderate to large knee effusion with mild synovitis. 3. Proximal popliteus tendinopathy. 4. Distal semimembranosus tendinopathy. 5. Non-fragmented osteochondral lesions along the medial femoral condyle and medial tibial plateau. Mild chondromalacia along the lateral patellar facet. Electronically Signed   By: Gaylyn Rong M.D.   On: 10/24/2022 14:51                 ROS: All systems reviewed are negative as they relate to the chief complaint within the history of present illness.  Patient denies fevers or chills.  Assessment & Plan: Visit Diagnoses:  1. Effusion, right knee   2. Effusion, left knee     Plan: Timothy Villarreal is a 52 y.o. male who presents to the office for review of bilateral knee MRI scans and evaluation of knee pain/swelling.  He has symptoms that came on after being hospitalized for sepsis.  He saw Dr. August Saucer while he was hospitalized who aspirated his knee with no growth of organisms.  He had subsequent repeat aspiration on 3/4 with mildly elevated cell count and neutrophil percentage but no organisms again.  He now returns saying that overall his knee pain is improving.  He has no constitutional symptoms.  Does have moderate effusions in both knees and with his history, plan for 1 more aspiration now that he is decidedly off antibiotics over the last 6 weeks.  Both knees were aspirated of about 25 to 30 cc of nonpurulent synovial fluid this will be sent off for fluid analysis and culture.  Toradol injections administered in both knees.  We will get blood work today and refer patient to follow-up with infectious disease (which he was supposed to do after the hospital but never was aware of this) and rheumatology.  Plan to call him with results from aspiration.  MRIs were reviewed today which demonstrate medial compartment and patellofemoral compartment osteoarthritis in the left knee with primarily medial compartment arthritis in the  right knee along with meniscal pathology that is present.  Follow-Up Instructions: No follow-ups on file.   Orders:  Orders Placed This Encounter  Procedures   Anaerobic and Aerobic Culture   Anaerobic and Aerobic Culture   Synovial Fluid Analysis, Complete   Synovial Fluid Analysis, Complete   CBC with Differential   C-reactive protein   Antinuclear  Antib (ANA)   Sed Rate (ESR)   Rheumatoid Factor   Cyclic citrul peptide antibody, IgG   Ambulatory referral to Rheumatology   Ambulatory referral to Infectious Disease   No orders of the defined types were placed in this encounter.     Procedures: Large Joint Inj: bilateral knee on 10/24/2022 9:09 AM Indications: diagnostic evaluation, joint swelling and pain Details: 18 G 1.5 in needle, superolateral approach  Arthrogram: No  Medications (Right): 5 mL lidocaine 1 %; 4 mL bupivacaine 0.25 % (4 cc bupivocaine mixed with 1 cc toradol) Aspirate (Right): 30 mL Medications (Left): 5 mL lidocaine 1 %; 4 mL bupivacaine 0.25 % (4 cc bupivocaine mixed with 1 cc toradol) Aspirate (Left): 25 mL Outcome: tolerated well, no immediate complications Procedure, treatment alternatives, risks and benefits explained, specific risks discussed. Consent was given by the patient. Immediately prior to procedure a time out was called to verify the correct patient, procedure, equipment, support staff and site/side marked as required. Patient was prepped and draped in the usual sterile fashion.       Clinical Data: No additional findings.  Objective: Vital Signs: There were no vitals taken for this visit.  Physical Exam:  Constitutional: Patient appears well-developed HEENT:  Head: Normocephalic Eyes:EOM are normal Neck: Normal range of motion Cardiovascular: Normal rate Pulmonary/chest: Effort normal Neurologic: Patient is alert Skin: Skin is warm Psychiatric: Patient has normal mood and affect  Ortho Exam: Ortho exam demonstrates bilateral knees with moderate effusion.  No calf tenderness.  Negative thumb sign.  Able to perform straight leg raise bilaterally.  He has no cellulitis or skin changes noted.  No significant warmth of the knees compared with the skin of his thighs or calves.  No pain with hip range of motion.  He has range of motion from 0 degrees extension to about 120 degrees in  the flexion.  Left knee has more crepitus in the patellofemoral area with passive motion of the knee compared with the right.  He has positive patellar grind test on the left, negative on the right.  He has tenderness over the medial joint line bilaterally but worse on the right.  Specialty Comments:  No specialty comments available.  Imaging: No results found.   PMFS History: Patient Active Problem List   Diagnosis Date Noted   Sepsis secondary to UTI 08/25/2022   Hypothyroidism 08/25/2022   Depression 08/25/2022   AKI (acute kidney injury) 08/25/2022   Sleep apnea 08/25/2022   Liver lesion, left lobe 08/25/2022   BPH (benign prostatic hyperplasia) 08/22/2022   Pain in right knee 09/14/2018   Past Medical History:  Diagnosis Date   ADHD (attention deficit hyperactivity disorder)    Anxiety    Arthritis    Cancer ([facility])    thyroid cancer   Complication of anesthesia    nausea / vomit (over 25 yrs ago)   Hypothyroidism    Hx thyroidectomy  2007   Sleep apnea     No family history on file.  Past Surgical History:  Procedure Laterality Date   EYE SURGERY     Lasik surgery   THYROIDECTOMY  2007   Social History   Occupational History   Not on file  Tobacco Use   Smoking status: Never   Smokeless tobacco: Never  Vaping Use   Vaping Use: Never used  Substance and Sexual Activity   Alcohol use: Not Currently   Drug use: Not Currently   Sexual activity: Not Currently

## 2022-10-26 LAB — CYCLIC CITRUL PEPTIDE ANTIBODY, IGG: Cyclic Citrullin Peptide Ab: <16 UNITS

## 2022-10-26 LAB — CBC WITH DIFFERENTIAL/PLATELET
Absolute Monocytes: 867 cells/uL (ref 200–950)
Basophils Absolute: 51 cells/uL (ref 0–200)
Basophils Relative: 0.6 %
Eosinophils Absolute: 153 cells/uL (ref 15–500)
Eosinophils Relative: 1.8 %
HCT: 45.7 % (ref 38.5–50.0)
Hemoglobin: 15.1 g/dL (ref 13.2–17.1)
Lymphs Abs: 1930 cells/uL (ref 850–3900)
MCH: 28 pg (ref 27.0–33.0)
MCHC: 33 g/dL (ref 32.0–36.0)
MCV: 84.6 fL (ref 80.0–100.0)
MPV: 11.4 fL (ref 7.5–12.5)
Monocytes Relative: 10.2 %
Neutro Abs: 5500 cells/uL (ref 1500–7800)
Neutrophils Relative %: 64.7 %
Platelets: 307 10*3/uL (ref 140–400)
RBC: 5.4 10*6/uL (ref 4.20–5.80)
RDW: 14 % (ref 11.0–15.0)
Total Lymphocyte: 22.7 %
WBC: 8.5 10*3/uL (ref 3.8–10.8)

## 2022-10-26 LAB — RHEUMATOID FACTOR: Rheumatoid fact SerPl-aCnc: <10 IU/mL (ref <14)

## 2022-10-26 LAB — ANTI-NUCLEAR AB-TITER (ANA TITER)
ANA TITER: 1:80 {titer} — ABNORMAL HIGH
ANA Titer 1: 1:160 {titer} — ABNORMAL HIGH

## 2022-10-26 LAB — SEDIMENTATION RATE: Sed Rate: 11 mm/h (ref 0–20)

## 2022-10-26 LAB — ANA: Anti Nuclear Antibody (ANA): POSITIVE — AB

## 2022-10-26 LAB — C-REACTIVE PROTEIN: CRP: <3 mg/L (ref <8.0)

## 2022-10-28 NOTE — Progress Notes (Signed)
Caled and discussed lab results

## 2022-10-30 LAB — ANAEROBIC AND AEROBIC CULTURE
AER RESULT:: NO GROWTH
MICRO NUMBER:: 14857214
MICRO NUMBER:: 14857215
SPECIMEN QUALITY:: ADEQUATE
SPECIMEN QUALITY:: ADEQUATE

## 2022-10-30 LAB — SYNOVIAL FLUID ANALYSIS, COMPLETE
Basophils, %: 0 %
Eosinophils-Synovial: 0 % (ref 0–2)
Lymphocytes-Synovial Fld: 58 % (ref 0–74)
Monocyte/Macrophage: 21 % (ref 0–69)
Neutrophil, Synovial: 21 % (ref 0–24)
Synoviocytes, %: 0 % (ref 0–15)
WBC, Synovial: 876 cells/uL — ABNORMAL HIGH (ref <150)

## 2022-10-31 LAB — ANAEROBIC AND AEROBIC CULTURE
AER RESULT:: NO GROWTH
MICRO NUMBER:: 14857199
MICRO NUMBER:: 14857200
SPECIMEN QUALITY:: ADEQUATE
SPECIMEN QUALITY:: ADEQUATE

## 2022-10-31 LAB — SYNOVIAL FLUID ANALYSIS, COMPLETE
Basophils, %: 0 %
Eosinophils-Synovial: 0 % (ref 0–2)
Lymphocytes-Synovial Fld: 82 % — ABNORMAL HIGH (ref 0–74)
Monocyte/Macrophage: 9 % (ref 0–69)
Neutrophil, Synovial: 9 % (ref 0–24)
Synoviocytes, %: 0 % (ref 0–15)
WBC, Synovial: 641 cells/uL — ABNORMAL HIGH (ref <150)

## 2022-11-07 NOTE — Progress Notes (Signed)
Called and discussed aspiration results. Was positive for gout crystals. No sign of infection. Culture negative. Knees feel much improved and he has no complaints regarding them

## 2022-11-08 ENCOUNTER — Telehealth: Payer: Self-pay

## 2022-11-08 NOTE — Telephone Encounter (Signed)
-----   Message from Julieanne Cotton, PA-C sent at 11/07/2022  6:13 PM EDT ----- Called and discussed aspiration results. Was positive for gout crystals. No sign of infection. Culture negative. Knees feel much improved and he has no complaints regarding them

## 2022-11-08 NOTE — Telephone Encounter (Signed)
Per note, Franky Macho discussed with patient

## 2022-11-09 NOTE — Progress Notes (Signed)
No follow-up needed. We reviewed his mri scans and aspiration results

## 2022-11-09 NOTE — Progress Notes (Signed)
I thinkluke may have ordered the scan.  Does he have follow-up with me or Franky Macho?

## 2022-11-10 ENCOUNTER — Other Ambulatory Visit: Payer: Self-pay

## 2022-11-10 ENCOUNTER — Encounter: Payer: Self-pay | Admitting: Internal Medicine

## 2022-11-10 ENCOUNTER — Ambulatory Visit (INDEPENDENT_AMBULATORY_CARE_PROVIDER_SITE_OTHER): Payer: BC Managed Care – PPO | Admitting: Internal Medicine

## 2022-11-10 VITALS — BP 136/88 | HR 70 | Resp 16 | Ht 74.0 in | Wt 255.0 lb

## 2022-11-10 DIAGNOSIS — M25461 Effusion, right knee: Secondary | ICD-10-CM | POA: Diagnosis not present

## 2022-11-10 DIAGNOSIS — M25462 Effusion, left knee: Secondary | ICD-10-CM

## 2022-11-10 DIAGNOSIS — M170 Bilateral primary osteoarthritis of knee: Secondary | ICD-10-CM

## 2022-11-10 NOTE — Progress Notes (Signed)
[facility]  Reason for Consult:bilateral knee swelling/pain Referring Provider: Harriette Bouillon    Patient Active Problem List   Diagnosis Date Noted   Sepsis secondary to UTI (HCC) 08/25/2022   Hypothyroidism 08/25/2022   Depression 08/25/2022   AKI (acute kidney injury) (HCC) 08/25/2022   Sleep apnea 08/25/2022   Liver lesion, left lobe 08/25/2022   BPH (benign prostatic hyperplasia) 08/22/2022   Pain in right knee 09/14/2018      HPI: Timothy Villarreal is a 52 y.o. male hypothryoidism, anxiety/depression, bph with recurrent uti, s/p robotic water jet ablation of prostate 08/22/22, referred by pcp/ortho for bilateral R>L knee effusion   Hx via chart/patient  Patient today said his knees feel very good; he is walking on them fine No fever/chill  Chart reviewed -- I can't clearly determine by chart presence exact time line. Below is best summary of what I could figure out  He has been following ortho for the knees Procedure: 10/24/22 left knee aspirate 641 wbc 82% lymph 10/24/22 right knee aspirate 876 wbc 58% lymph 3/04 left knee 8.6k wbc 72% neutrophil 3/04 right knee 7.7k wbc 92% neutrophil No eosinophils or crystal 2/ left knee 2600 wbc 61% neutrophil  From 08/2022-10/2022 knees cultures negative, and again no crystal  08/2022 hiv testing negative  4/19 mri left knee 1. Degenerative signal in the medial meniscus with free edge fraying in the midbody and posterior horn but without a well-defined grade 3 tear. 2. Mild edema tracks adjacent to the MCL. This can be incidental but in the appropriate clinical circumstance could represent grade 1 sprain. 3. Moderate knee effusion with mild synovitis. 4. Trace pes anserine bursitis. 5. Mild to moderate degenerative chondral thinning in the medial compartment with mild chondral thinning along the medial and lateral patellar facets.  4/19 mri right knee 1. Torn midbody and  adjacent anterior posterior horns medial meniscus. The anterior horn component has a small free edge flap. Indistinct 1.5 cm parameniscal cyst anterior to the anterior root. 2. Moderate to large knee effusion with mild synovitis. 3. Proximal popliteus tendinopathy. 4. Distal semimembranosus tendinopathy. 5. Non-fragmented osteochondral lesions along the medial femoral condyle and medial tibial plateau. Mild chondromalacia along the lateral patellar facet.  Abx course: 2/15-24 cipro 06/14/22 to 08/2022 cephalexin/doxy/bactrim given unclear duration and indication but he mentions he took that for prostate/uti    He never recalls any prolonged dedicated abx course for the knees He had initial few days bilateral knee swelling/pain after prostate surgery but as indicated above 08/2022 culture/aspirate analysis unremarkable no prolonged abx  The swelling flared here and there and had 3 subsequent aspiration again. But patient said outside of the initial occurrence, he felt ok with the knees. And again today the knees feel normal baseline   He does have pending rheumatology evaluation as well His ana is mildly elevated 1:160  No connective tissue in family  He denies history of knees trauma -- he played foot ball high school. No prior knee surgery  He does feel he had history of right knee giving out on him through out   He visited [place] often and he has had travel to [place].  Last time in [place] 4-5 years ago  He is oritinally from [place]  No prior diagnosis of LYME   He used to be Mining engineer.    Review of Systems: ROS All other ros negative  Past Medical History:  Diagnosis Date   ADHD (attention deficit hyperactivity disorder)    Anxiety    Arthritis    Cancer (HCC)    thyroid cancer   Complication of anesthesia    nausea / vomit (over 25 yrs ago)   Hypothyroidism    Hx thyroidectomy  2007   Sleep apnea      Social History   Tobacco Use   Smoking status: Never   Smokeless tobacco: Never  Vaping Use   Vaping Use: Never used  Substance Use Topics   Alcohol use: Not Currently   Drug use: Not Currently    No family history on file.  No Known Allergies  OBJECTIVE: Vitals:   11/10/22 1021  BP: 136/88  Pulse: 70  Resp: 16  SpO2: 98%  Weight: 255 lb (115.7 kg)  Height: 6\' 2"  (1.88 m)   There is no height or weight on file to calculate BMI.   Physical Exam General/constitutional: no distress, pleasant HEENT: Normocephalic, PER, Conj Clear, EOMI, Oropharynx clear Neck supple CV: rrr no mrg Lungs: clear to auscultation, normal respiratory effort Abd: Soft, Nontender Ext: no edema Skin: No Rash Neuro: nonfocal MSK: no peripheral joint swelling/tenderness/warmth; back spines nontender   Lab: Lab Results  Component Value Date   WBC 8.5 10/24/2022   HGB 15.1 10/24/2022   HCT 45.7 10/24/2022   MCV 84.6 10/24/2022   PLT 307 10/24/2022   Last metabolic panel Lab Results  Component Value Date   GLUCOSE 114 (H) 08/27/2022   NA 137 08/27/2022   K 3.6 08/27/2022   CL 101 08/27/2022   CO2 26 08/27/2022   BUN 21 (H) 08/27/2022   CREATININE 0.96 08/27/2022   GFRNONAA >60 08/27/2022   CALCIUM 8.7 (L) 08/27/2022   PHOS 4.1 08/27/2022   PROT 6.8 08/27/2022   ALBUMIN 3.8 08/27/2022   BILITOT 0.6 08/27/2022   ALKPHOS 74 08/27/2022   AST 28 08/27/2022   ALT 35 08/27/2022   ANIONGAP 10 08/27/2022    Lab Results  Component Value Date   ESRSEDRATE 11 10/24/2022   Lab Results  Component Value Date   CRP <3.0 10/24/2022    Microbiology:  Serology:  Imaging:   Assessment/plan: Problem List Items Addressed This Visit   None Visit Diagnoses     Osteoarthritis of both knees, unspecified osteoarthritis type    -  Primary   Bilateral knee effusions       Relevant Orders   B. burgdorfi antibodies         From history and clinical evaluation, the  bilateral knee symptoms/signs are consistent with DJD  He had lived in lyme endemic area in the past and potentially could have basically untreated lyme complication like arthritis  Lyme testing potentially could be somewhat helpful but if this is truly late complication of untreated lyme, unlikely any treatment options in terms of antibiotics could help (although we could consider a couple course of doxycycline). He does might benefit from supportive care and rheum evaluation   Test lyme today  F/u in around 1-2 weeks      Follow-up: Return in about 1 week (around 11/17/2022).   I have spent a total of 65 minutes of face-to-face and non-face-to-face time, excluding clinical staff time, preparing to see patient, ordering tests and/or medications, and provide counseling the patient    Raymondo Band, MD [facility] 11/10/2022, 10:21 AM

## 2022-11-10 NOTE — Patient Instructions (Signed)
I think this is likely degenerative joint disease    Since you lived in tick area in the past lyme could be playing a role. Untreated lyme sometimes can caused arthritis of big joints like the knee. However antibiotics for this is unclear if helpful   Let's check lyme and discuss again in 1-2 weeks   Keep rheumatology appointment

## 2022-11-11 LAB — B. BURGDORFI ANTIBODIES: B burgdorferi Ab IgG+IgM: <0.9 index

## 2023-03-02 ENCOUNTER — Encounter: Payer: BC Managed Care – PPO | Admitting: Internal Medicine

## 2023-03-13 ENCOUNTER — Encounter: Payer: Self-pay | Admitting: Internal Medicine

## 2023-03-13 ENCOUNTER — Ambulatory Visit: Payer: BC Managed Care – PPO | Attending: Internal Medicine | Admitting: Internal Medicine

## 2023-03-13 VITALS — BP 130/87 | HR 59 | Resp 14 | Ht 74.5 in | Wt 238.0 lb

## 2023-03-13 DIAGNOSIS — M25461 Effusion, right knee: Secondary | ICD-10-CM | POA: Diagnosis not present

## 2023-03-13 DIAGNOSIS — M67449 Ganglion, unspecified hand: Secondary | ICD-10-CM | POA: Diagnosis not present

## 2023-03-13 DIAGNOSIS — R768 Other specified abnormal immunological findings in serum: Secondary | ICD-10-CM | POA: Insufficient documentation

## 2023-03-13 DIAGNOSIS — M25462 Effusion, left knee: Secondary | ICD-10-CM | POA: Diagnosis not present

## 2023-03-13 NOTE — Patient Instructions (Signed)
Your right index finger has a small digital mucinous cyst this usually indicates some degree of osteoarthritis developing. That is wear and tear type of joint changes that can cause bon nodules, stiffness, and sometimes pain with use.  I am checking for evidence of persistent gout or for specific antibodies associated with the positive ANA test.

## 2023-03-13 NOTE — Progress Notes (Signed)
Office Visit Note  Patient: Timothy Villarreal             Date of Birth: Aug 13, 1970           MRN: [id number]             PCP: Jettie Pagan, NP Referring: Jettie Pagan, NP Visit Date: 03/13/2023 Occupation: Risk analyst  Subjective:  New Patient (Initial Visit) (Patient states he has pain in his knees, hands, fingers, and back. Patient state he has pain in his legs at night. Patient states he got septic during his prostate surgery and that is when the knees started bothering him. )   History of Present Illness: Timothy Villarreal is a 52 y.o. male here for evaluation of positive ANA and with bilateral knee pain and recurrent effusions starting since February of this year after hospitalization for sepsis complicating a UTI after surgery for BPH. He was treated with water jet ablation of the prostate on February 19 but return to the emergency department with fever fatigue low back pain and bladder spasms on February 21.  He was being treated with ciprofloxacin already and by arrival at the ED also had increased pain in bilateral knees especially with pain and large effusion in the left knee.  Labs were consistent with AKI attributed to prerenal azotemia.  Urine cultures were positive for Staph epidermidis.  He had bilateral knee aspiration with 120 cc removed from the left knee the 20 cc from the right knee with fluid analysis white blood cell count of 2640 with 81% neutrophils negative for bacteria or crystals.  Subsequent follow-up in orthopedics clinic on March 4 had repeat bilateral knee aspirations with left knee fluid 8600 white blood cells right knee 7700 white blood cells.  Reaccumulated fluid again and was sent for bilateral knee MRIs these demonstrated degenerative changes and meniscal tears as well as synovitis and tendinopathy.  Last repeat aspiration April 22 with significantly decreased white blood cells highest in the right knee 876 this was positive for extracellular  monosodium urate crystals.  He has been taking just Tylenol and Aleve as needed intermittently for pain but doing pretty well since the last round of joint aspirations.  Besides his knees he has some moderate pain and stiffness without associated swelling in his hands.  This is most noticeable when playing guitar for long periods. He does report a few previous gout attacks involving his right foot and ankle but these were not a regular occurrence and had no preceding episodes for more than 1 year before this hospitalization in February. He gets some erythematous skin rash on his left upper arm with associated paresthesias at least about once a year.  This may be related with sun exposure but he is not sure.  Denies any new oral nasal ulcers, lymphadenopathy, Raynaud's symptoms, abnormal bruising or bleeding.  Labs reviewed 10/2022 ANa 1:160 homogenous 1:80 fine speckled R Knee 876 WBCs L Knee 641 WBCs  09/2022 R Knee 7,679 WBCs L Knee 8,615 WBCs  08/2022 Uric acid 5.3 L Knee effusion 2640 WBCs  Imaging reviewed 10/21/22 MRI left knee 1. Degenerative signal in the medial meniscus with free edge fraying in the midbody and posterior horn but without a well-defined grade 3 tear. 2. Mild edema tracks adjacent to the MCL. This can be incidental but in the appropriate clinical circumstance could represent grade 1 sprain. 3. Moderate knee effusion with mild synovitis. 4. Trace pes anserine bursitis. 5. Mild to moderate degenerative  chondral thinning in the medial compartment with mild chondral thinning along the medial and lateral patellar facets.   10/21/22 MRI right knee 1. Torn midbody and adjacent anterior posterior horns medial meniscus. The anterior horn component has a small free edge flap. Indistinct 1.5 cm parameniscal cyst anterior to the anterior root. 2. Moderate to large knee effusion with mild synovitis. 3. Proximal popliteus tendinopathy. 4. Distal semimembranosus tendinopathy. 5.  Non-fragmented osteochondral lesions along the medial femoral condyle and medial tibial plateau. Mild chondromalacia along the lateral patellar facet.     Activities of Daily Living:  Patient reports morning stiffness for 2 hours.   Patient Reports nocturnal pain.  Difficulty dressing/grooming: Denies Difficulty climbing stairs: Denies Difficulty getting out of chair: Denies Difficulty using hands for taps, buttons, cutlery, and/or writing: Reports  Review of Systems  Constitutional:  Positive for fatigue.  HENT:  Positive for mouth dryness. Negative for mouth sores.   Eyes:  Positive for dryness.  Respiratory:  Negative for shortness of breath.   Cardiovascular:  Negative for chest pain and palpitations.  Gastrointestinal:  Negative for blood in stool, constipation and diarrhea.  Endocrine: Negative for increased urination.  Genitourinary:  Negative for involuntary urination.  Musculoskeletal:  Positive for joint pain, joint pain, joint swelling, myalgias, morning stiffness and myalgias. Negative for gait problem, muscle weakness and muscle tenderness.  Skin:  Positive for rash and sensitivity to sunlight. Negative for color change and hair loss.  Allergic/Immunologic: Negative for susceptible to infections.  Neurological:  Positive for headaches. Negative for dizziness.  Hematological:  Negative for swollen glands.  Psychiatric/Behavioral:  Positive for depressed mood and sleep disturbance. The patient is not nervous/anxious.     PMFS History:  Patient Active Problem List   Diagnosis Date Noted   Bilateral knee effusions 03/13/2023   Positive ANA (antinuclear antibody) 03/13/2023   Digital mucinous cyst 03/13/2023   Hypothyroidism 08/25/2022   Depression 08/25/2022   AKI (acute kidney injury) (HCC) 08/25/2022   Sleep apnea 08/25/2022   Liver lesion, left lobe 08/25/2022   BPH (benign prostatic hyperplasia) 08/22/2022   Pain in right knee 09/14/2018    Past Medical  History:  Diagnosis Date   ADHD (attention deficit hyperactivity disorder)    Anxiety    Arthritis    Cancer (HCC)    thyroid cancer   Complication of anesthesia    nausea / vomit (over 25 yrs ago)   Hypothyroidism    Hx thyroidectomy  2007   Sepsis secondary to UTI (HCC) 08/25/2022   Sleep apnea     Family History  Problem Relation Age of Onset   COPD Mother    Emphysema Mother    Aortic aneurysm Father    Past Surgical History:  Procedure Laterality Date   EYE SURGERY     Lasik surgery   PROSTATE SURGERY     THYROIDECTOMY  2007   Social History   Social History Narrative   Not on file   Immunization History  Administered Date(s) Administered   Influenza Inj Mdck Quad Pf 04/29/2022   Moderna Sars-Covid-2 Vaccination 09/26/2019, 10/29/2019   Tdap 03/21/2013     Objective: Vital Signs: BP 130/87 (BP Location: Right Arm, Patient Position: Sitting, Cuff Size: Normal)   Pulse (!) 59   Resp 14   Ht 6' 2.5" (1.892 m)   Wt 238 lb (108 kg)   BMI 30.15 kg/m    Physical Exam HENT:     Mouth/Throat:     Mouth: Mucous membranes  are moist.     Pharynx: Oropharynx is clear.  Eyes:     Conjunctiva/sclera: Conjunctivae normal.  Cardiovascular:     Rate and Rhythm: Normal rate and regular rhythm.  Pulmonary:     Effort: Pulmonary effort is normal.     Breath sounds: Normal breath sounds.  Musculoskeletal:     Right lower leg: No edema.     Left lower leg: No edema.  Lymphadenopathy:     Cervical: No cervical adenopathy.  Skin:    General: Skin is warm and dry.     Findings: No rash.  Neurological:     Mental Status: He is alert.  Psychiatric:        Mood and Affect: Mood normal.      Musculoskeletal Exam:  Shoulders full ROM no tenderness or swelling Elbows full ROM no tenderness or swelling Wrists full ROM no tenderness or swelling Fingers full ROM no tenderness or swelling, right second DIP mucinous cyst present at base of fingernail No paraspinal  tenderness to palpation over upper and lower back Hip normal internal and external rotation without pain, no tenderness to lateral hip palpation Knees full ROM no tenderness, small left knee effusion present Ankles full ROM no tenderness or swelling   Investigation: No additional findings.  Imaging: No results found.  Recent Labs: Lab Results  Component Value Date   WBC 8.5 10/24/2022   HGB 15.1 10/24/2022   PLT 307 10/24/2022   NA 137 08/27/2022   K 3.6 08/27/2022   CL 101 08/27/2022   CO2 26 08/27/2022   GLUCOSE 114 (H) 08/27/2022   BUN 21 (H) 08/27/2022   CREATININE 0.96 08/27/2022   BILITOT 0.6 08/27/2022   ALKPHOS 74 08/27/2022   AST 28 08/27/2022   ALT 35 08/27/2022   PROT 6.8 08/27/2022   ALBUMIN 3.8 08/27/2022   CALCIUM 8.7 (L) 08/27/2022   GFRAA >60 09/18/2019    Speciality Comments: No specialty comments available.  Procedures:  No procedures performed Allergies: Patient has no known allergies.   Assessment / Plan:     Visit Diagnoses: Positive ANA (antinuclear antibody) - Plan: RNP Antibody, Anti-Smith antibody, Sjogrens syndrome-A extractable nuclear antibody, Sjogrens syndrome-B extractable nuclear antibody, Anti-DNA antibody, double-stranded, C3 and C4  ANA at moderately high positive but only clinical criteria joint inflammation in the questionably photosensitive rash.  Check specific antibody markers today also serum complements for any evidence of SLE or related autoimmune process.  Previous negative screening for RA and with other serum inflammatory markers normal I think this is likely to be negative.  Bilateral knee effusions - Plan: Uric acid  Bilateral knee swelling fluid was clearly inflammatory in nature from earlier aspiration in March most recent in April significantly decreased.  Less likely gout with no intracellular crystals and noninflammatory aspirate.  Some degenerative changes and bilateral meniscal tears could also account for a  persistent noninflammatory source of effusions.  The clinical course could be consistent with a reactive arthritis following genitourinary infection source with more frankly inflammatory fluid after the initial treatment of infection but this might not account for the large knee effusions at time of presentation in the hospital.  The process would usually be self-limited with resolution in 6 to 12 months unless developing into a chronic seronegative arthritis but his progression so far is reassuring.  Digital mucinous cyst  Discussed digital mucinous cyst of the right index finger this looks consistent with early osteoarthritis of the hands.  Reported symptoms of pain and  stiffness primarily with extended use no associated swelling.  Provided printed handout on supplemental treatment options for osteoarthritis.  No specific intervention for cyst recommended unless this becomes more symptomatic.  Orders: Orders Placed This Encounter  Procedures   Uric acid   RNP Antibody   Anti-Smith antibody   Sjogrens syndrome-A extractable nuclear antibody   Sjogrens syndrome-B extractable nuclear antibody   Anti-DNA antibody, double-stranded   C3 and C4   No orders of the defined types were placed in this encounter.   Follow-Up Instructions: No follow-ups on file.   Fuller Plan, MD  Note - This record has been created using [employer].  Chart creation errors have been sought, but may not always  have been located. Such creation errors do not reflect on  the standard of medical care.

## 2023-03-14 LAB — SJOGRENS SYNDROME-A EXTRACTABLE NUCLEAR ANTIBODY: SSA (Ro) (ENA) Antibody, IgG: <1 AI

## 2023-03-14 LAB — C3 AND C4
C3 Complement: 126 mg/dL (ref 82–185)
C4 Complement: 22 mg/dL (ref 15–53)

## 2023-03-14 LAB — RNP ANTIBODY: Ribonucleic Protein(ENA) Antibody, IgG: <1 AI

## 2023-03-14 LAB — SJOGRENS SYNDROME-B EXTRACTABLE NUCLEAR ANTIBODY: SSB (La) (ENA) Antibody, IgG: <1 AI

## 2023-03-14 LAB — ANTI-DNA ANTIBODY, DOUBLE-STRANDED: ds DNA Ab: <1 [IU]/mL

## 2023-03-14 LAB — ANTI-SMITH ANTIBODY: ENA SM Ab Ser-aCnc: <1 AI

## 2023-03-14 LAB — URIC ACID: Uric Acid, Serum: 7.2 mg/dL (ref 4.0–8.0)

## 2023-03-15 NOTE — Progress Notes (Signed)
The additional antibody tests for the positive ANA were all negative so I do not think this indicates a problem like lupus. This does not entirely rule out a reactive type of joint inflammation as we discussed but that problem would usually resolve on its own without additional medications. The uric acid level was 7.2 this is consistent with the previously reported gout findings but it is not so high that we would have to start on maintenance medications unless flareups became more frequent. I do not think we need to schedule any routine follow up, but we can see him back as needed if joint pain and swelling worsens again instead of improving.

## 2023-04-17 ENCOUNTER — Ambulatory Visit: Payer: BC Managed Care – PPO | Admitting: Internal Medicine

## 2023-04-17 NOTE — Progress Notes (Deleted)
Office Visit Note  Patient: Timothy Villarreal             Date of Birth: 10/16/1970           MRN: [id number]             PCP: Jettie Pagan, NP Referring: Jettie Pagan, NP Visit Date: 04/17/2023   Subjective:  No chief complaint on file.   History of Present Illness: Timothy MAZZUCA is a 52 y.o. male here for follow up ***   Previous HPI 03/13/23 Timothy Villarreal is a 52 y.o. male here for evaluation of positive ANA and with bilateral knee pain and recurrent effusions starting since February of this year after hospitalization for sepsis complicating a UTI after surgery for BPH. He was treated with water jet ablation of the prostate on February 19 but return to the emergency department with fever fatigue low back pain and bladder spasms on February 21.  He was being treated with ciprofloxacin already and by arrival at the ED also had increased pain in bilateral knees especially with pain and large effusion in the left knee.  Labs were consistent with AKI attributed to prerenal azotemia.  Urine cultures were positive for Staph epidermidis.  He had bilateral knee aspiration with 120 cc removed from the left knee the 20 cc from the right knee with fluid analysis white blood cell count of 2640 with 81% neutrophils negative for bacteria or crystals.  Subsequent follow-up in orthopedics clinic on March 4 had repeat bilateral knee aspirations with left knee fluid 8600 white blood cells right knee 7700 white blood cells.  Reaccumulated fluid again and was sent for bilateral knee MRIs these demonstrated degenerative changes and meniscal tears as well as synovitis and tendinopathy.  Last repeat aspiration April 22 with significantly decreased white blood cells highest in the right knee 876 this was positive for extracellular monosodium urate crystals.  He has been taking just Tylenol and Aleve as needed intermittently for pain but doing pretty well since the last round of joint aspirations.   Besides his knees he has some moderate pain and stiffness without associated swelling in his hands.  This is most noticeable when playing guitar for long periods. He does report a few previous gout attacks involving his right foot and ankle but these were not a regular occurrence and had no preceding episodes for more than 1 year before this hospitalization in February. He gets some erythematous skin rash on his left upper arm with associated paresthesias at least about once a year.  This may be related with sun exposure but he is not sure.  Denies any new oral nasal ulcers, lymphadenopathy, Raynaud's symptoms, abnormal bruising or bleeding.   Labs reviewed 10/2022 ANa 1:160 homogenous 1:80 fine speckled R Knee 876 WBCs L Knee 641 WBCs   09/2022 R Knee 7,679 WBCs L Knee 8,615 WBCs   08/2022 Uric acid 5.3 L Knee effusion 2640 WBCs   Imaging reviewed 10/21/22 MRI left knee 1. Degenerative signal in the medial meniscus with free edge fraying in the midbody and posterior horn but without a well-defined grade 3 tear. 2. Mild edema tracks adjacent to the MCL. This can be incidental but in the appropriate clinical circumstance could represent grade 1 sprain. 3. Moderate knee effusion with mild synovitis. 4. Trace pes anserine bursitis. 5. Mild to moderate degenerative chondral thinning in the medial compartment with mild chondral thinning along the medial and lateral patellar facets.   10/21/22  MRI right knee 1. Torn midbody and adjacent anterior posterior horns medial meniscus. The anterior horn component has a small free edge flap. Indistinct 1.5 cm parameniscal cyst anterior to the anterior root. 2. Moderate to large knee effusion with mild synovitis. 3. Proximal popliteus tendinopathy. 4. Distal semimembranosus tendinopathy. 5. Non-fragmented osteochondral lesions along the medial femoral condyle and medial tibial plateau. Mild chondromalacia along the lateral patellar facet.   No  Rheumatology ROS completed.   PMFS History:  Patient Active Problem List   Diagnosis Date Noted   Bilateral knee effusions 03/13/2023   Positive ANA (antinuclear antibody) 03/13/2023   Digital mucinous cyst 03/13/2023   Hypothyroidism 08/25/2022   Depression 08/25/2022   AKI (acute kidney injury) (HCC) 08/25/2022   Sleep apnea 08/25/2022   Liver lesion, left lobe 08/25/2022   BPH (benign prostatic hyperplasia) 08/22/2022   Pain in right knee 09/14/2018    Past Medical History:  Diagnosis Date   ADHD (attention deficit hyperactivity disorder)    Anxiety    Arthritis    Cancer (HCC)    thyroid cancer   Complication of anesthesia    nausea / vomit (over 25 yrs ago)   Hypothyroidism    Hx thyroidectomy  2007   Sepsis secondary to UTI (HCC) 08/25/2022   Sleep apnea     Family History  Problem Relation Age of Onset   COPD Mother    Emphysema Mother    Aortic aneurysm Father    Past Surgical History:  Procedure Laterality Date   EYE SURGERY     Lasik surgery   PROSTATE SURGERY     THYROIDECTOMY  2007   Social History   Social History Narrative   Not on file   Immunization History  Administered Date(s) Administered   Influenza Inj Mdck Quad Pf 04/29/2022   Moderna Sars-Covid-2 Vaccination 09/26/2019, 10/29/2019   Tdap 03/21/2013     Objective: Vital Signs: There were no vitals taken for this visit.   Physical Exam   Musculoskeletal Exam: ***  CDAI Exam: CDAI Score: -- Patient Global: --; Provider Global: -- Swollen: --; Tender: -- Joint Exam 04/17/2023   No joint exam has been documented for this visit   There is currently no information documented on the homunculus. Go to the Rheumatology activity and complete the homunculus joint exam.  Investigation: No additional findings.  Imaging: No results found.  Recent Labs: Lab Results  Component Value Date   WBC 8.5 10/24/2022   HGB 15.1 10/24/2022   PLT 307 10/24/2022   NA 137 08/27/2022   K  3.6 08/27/2022   CL 101 08/27/2022   CO2 26 08/27/2022   GLUCOSE 114 (H) 08/27/2022   BUN 21 (H) 08/27/2022   CREATININE 0.96 08/27/2022   BILITOT 0.6 08/27/2022   ALKPHOS 74 08/27/2022   AST 28 08/27/2022   ALT 35 08/27/2022   PROT 6.8 08/27/2022   ALBUMIN 3.8 08/27/2022   CALCIUM 8.7 (L) 08/27/2022   GFRAA >60 09/18/2019    Speciality Comments: No specialty comments available.  Procedures:  No procedures performed Allergies: Patient has no known allergies.   Assessment / Plan:     Visit Diagnoses: No diagnosis found.  ***  Orders: No orders of the defined types were placed in this encounter.  No orders of the defined types were placed in this encounter.    Follow-Up Instructions: No follow-ups on file.   Fuller Plan, MD  Note - This record has been created using [employer].  Chart creation errors have been sought, but may not always  have been located. Such creation errors do not reflect on  the standard of medical care.

## 2023-08-08 ENCOUNTER — Encounter: Payer: BC Managed Care – PPO | Admitting: Internal Medicine

## 2024-02-09 ENCOUNTER — Ambulatory Visit: Admitting: Surgical

## 2024-02-09 ENCOUNTER — Other Ambulatory Visit (INDEPENDENT_AMBULATORY_CARE_PROVIDER_SITE_OTHER): Payer: Self-pay

## 2024-02-09 ENCOUNTER — Encounter: Payer: Self-pay | Admitting: Surgical

## 2024-02-09 DIAGNOSIS — M1712 Unilateral primary osteoarthritis, left knee: Secondary | ICD-10-CM | POA: Diagnosis not present

## 2024-02-09 DIAGNOSIS — M25562 Pain in left knee: Secondary | ICD-10-CM | POA: Diagnosis not present

## 2024-02-09 DIAGNOSIS — G8929 Other chronic pain: Secondary | ICD-10-CM

## 2024-02-09 MED ORDER — TRIAMCINOLONE ACETONIDE 40 MG/ML IJ SUSP
40.0000 mg | INTRAMUSCULAR | Status: AC | PRN
Start: 2024-02-09 — End: 2024-02-09
  Administered 2024-02-09: 40 mg via INTRA_ARTICULAR

## 2024-02-09 MED ORDER — BUPIVACAINE HCL 0.25 % IJ SOLN
4.0000 mL | INTRAMUSCULAR | Status: AC | PRN
Start: 2024-02-09 — End: 2024-02-09
  Administered 2024-02-09: 4 mL via INTRA_ARTICULAR

## 2024-02-09 MED ORDER — LIDOCAINE HCL 1 % IJ SOLN
5.0000 mL | INTRAMUSCULAR | Status: AC | PRN
Start: 2024-02-09 — End: 2024-02-09
  Administered 2024-02-09: 5 mL

## 2024-02-09 NOTE — Progress Notes (Signed)
 Office Visit Note   Patient: Timothy Villarreal           Date of Birth: 04-14-1971           MRN: [id number] Visit Date: 02/09/2024 Requested by: Arby Lyle LABOR, NP [address] [place],  [place] [address] PCP: Arby Lyle LABOR, NP  Subjective: Chief Complaint  Patient presents with   Left Knee - Pain    HPI: Timothy Villarreal is a 53 y.o. male who presents to the office reporting left knee pain.  Patient states that over the last month he has developed constant left knee pain that is worse with ambulation.  He has had on and off swelling.  Describes anterior medial pain and buckling of the knee.  Not taking any medications for pain.  No fevers or chills.  Does have history of prior difficulties with this knee with multiple effusions that had to be aspirated demonstrating initially elevated cell count after a sepsis hospitalization but serial aspirations demonstrated resolution of that.  He also has history of gout crystals in the synovial fluid.  No other joint pains..                ROS: All systems reviewed are negative as they relate to the chief complaint within the history of present illness.  Patient denies fevers or chills.  Assessment & Plan: Visit Diagnoses:  1. Arthritis of left knee   2. Chronic pain of left knee     Plan: Plan at this time is aspiration injection of left knee.  Has left knee arthritis with history of slightly elevated cell counts and neutrophil percentages on prior aspirations of his knees from last year.  Currently not having any constitutional symptoms and aspiration fluid from the left knee today appeared noninfectious.  60 cc of nonpurulent synovial fluid aspirated.  Cortisone injection administered and patient tolerated procedure well.  Plan to follow-up with the office as needed.  Follow-Up Instructions: No follow-ups on file.   Orders:  Orders Placed This Encounter  Procedures   XR KNEE 3 VIEW LEFT   No orders of the  defined types were placed in this encounter.     Procedures: Large Joint Inj: L knee on 02/09/2024 1:54 PM Indications: diagnostic evaluation, joint swelling and pain Details: 18 G 1.5 in needle, superolateral approach  Arthrogram: No  Medications: 5 mL lidocaine  1 %; 4 mL bupivacaine  0.25 %; 40 mg triamcinolone  acetonide 40 MG/ML Aspirate: 60 mL Outcome: tolerated well, no immediate complications Procedure, treatment alternatives, risks and benefits explained, specific risks discussed. Consent was given by the patient. Immediately prior to procedure a time out was called to verify the correct patient, procedure, equipment, support staff and site/side marked as required. Patient was prepped and draped in the usual sterile fashion.       Clinical Data: No additional findings.  Objective: Vital Signs: There were no vitals taken for this visit.  Physical Exam:  Constitutional: Patient appears well-developed HEENT:  Head: Normocephalic Eyes:EOM are normal Neck: Normal range of motion Cardiovascular: Normal rate Pulmonary/chest: Effort normal Neurologic: Patient is alert Skin: Skin is warm Psychiatric: Patient has normal mood and affect  Ortho Exam: Ortho exam demonstrates left knee with 0 degrees extension and 125 degrees knee flexion.  Moderate effusion present.  Has mild medial joint line tenderness.  No lateral joint line tenderness.  Has fairly fluid range of motion without any significant pain until terminal extension.  No calf tenderness.  Negative Homans' sign.  Palpable PT pulse.  Able to perform straight leg raise.  No pain with hip range of motion.  Stable to anterior and posterior drawer sign.  Stable to varus and valgus stress at 0 and 30 degrees.  No cellulitis or skin changes noted.  Specialty Comments:  No specialty comments available.  Imaging: No results found.   PMFS History: Patient Active Problem List   Diagnosis Date Noted   Bilateral knee effusions  03/13/2023   Positive ANA (antinuclear antibody) 03/13/2023   Digital mucinous cyst 03/13/2023   Hypothyroidism 08/25/2022   Depression 08/25/2022   AKI (acute kidney injury) (HCC) 08/25/2022   Sleep apnea 08/25/2022   Liver lesion, left lobe 08/25/2022   BPH (benign prostatic hyperplasia) 08/22/2022   Pain in right knee 09/14/2018   Past Medical History:  Diagnosis Date   ADHD (attention deficit hyperactivity disorder)    Anxiety    Arthritis    Cancer (HCC)    thyroid  cancer   Complication of anesthesia    nausea / vomit (over 25 yrs ago)   Hypothyroidism    Hx thyroidectomy  2007   Sepsis secondary to UTI (HCC) 08/25/2022   Sleep apnea     Family History  Problem Relation Age of Onset   COPD Mother    Emphysema Mother    Aortic aneurysm Father     Past Surgical History:  Procedure Laterality Date   EYE SURGERY     Lasik surgery   PROSTATE SURGERY     THYROIDECTOMY  2007   Social History   Occupational History   Not on file  Tobacco Use   Smoking status: Never    Passive exposure: Past   Smokeless tobacco: Never  Vaping Use   Vaping status: Never Used  Substance and Sexual Activity   Alcohol use: Not Currently   Drug use: Not Currently   Sexual activity: Not Currently

## 2024-04-03 ENCOUNTER — Encounter: Payer: Self-pay | Admitting: Podiatry

## 2024-04-03 ENCOUNTER — Ambulatory Visit

## 2024-04-03 ENCOUNTER — Ambulatory Visit (INDEPENDENT_AMBULATORY_CARE_PROVIDER_SITE_OTHER): Admitting: Podiatry

## 2024-04-03 VITALS — Ht 74.5 in | Wt 238.0 lb

## 2024-04-03 DIAGNOSIS — M19072 Primary osteoarthritis, left ankle and foot: Secondary | ICD-10-CM

## 2024-04-03 DIAGNOSIS — M7752 Other enthesopathy of left foot: Secondary | ICD-10-CM

## 2024-04-03 DIAGNOSIS — M10072 Idiopathic gout, left ankle and foot: Secondary | ICD-10-CM

## 2024-04-03 DIAGNOSIS — M7751 Other enthesopathy of right foot: Secondary | ICD-10-CM

## 2024-04-03 MED ORDER — BETAMETHASONE SOD PHOS & ACET 6 (3-3) MG/ML IJ SUSP
3.0000 mg | Freq: Once | INTRAMUSCULAR | Status: AC
Start: 2024-04-03 — End: 2024-04-03
  Administered 2024-04-03: 3 mg via INTRA_ARTICULAR

## 2024-04-03 MED ORDER — MELOXICAM 15 MG PO TABS: 15.0000 mg | ORAL_TABLET | Freq: Every day | ORAL | 1 refills | Status: AC

## 2024-04-03 MED ORDER — METHYLPREDNISOLONE 4 MG PO TBPK: ORAL_TABLET | ORAL | 0 refills | Status: AC

## 2024-04-03 NOTE — Progress Notes (Signed)
   Chief Complaint  Patient presents with   Foot Pain    Pt is here due to left foot pain states he broke the foot in 2009 and has been having on and off pain since, no new injury,    HPI: 53 y.o. male presenting today for new complaint of pain and tenderness associated to the left foot.  Idiopathic onset for about 1 week.  Concern for possible gout.  He was last seen in the office on 04/27/2022 for acute gout of the right foot  Past Medical History:  Diagnosis Date   ADHD (attention deficit hyperactivity disorder)    Anxiety    Arthritis    Cancer (HCC)    thyroid  cancer   Complication of anesthesia    nausea / vomit (over 25 yrs ago)   Hypothyroidism    Hx thyroidectomy  2007   Sepsis secondary to UTI (HCC) 08/25/2022   Sleep apnea     Past Surgical History:  Procedure Laterality Date   EYE SURGERY     Lasik surgery   PROSTATE SURGERY     THYROIDECTOMY  2007    No Known Allergies   Physical Exam: General: The patient is alert and oriented x3 in no acute distress.  Dermatology: Skin is warm, dry and supple bilateral lower extremities.   Vascular: Palpable pedal pulses bilaterally. Capillary refill within normal limits.  Moderate edema and erythema noted around the TMT and midtarsal joint left foot  Neurological: Grossly intact via light touch  Musculoskeletal Exam: Tenderness with palpation noted around the TMT of the left foot  Radiographic Exam LT foot 04/03/2024:  Localized degenerative changes noted around the anterior process of the calcaneus is a articulates with the cuboid.  Avulsed fragment of the cuboid also noted which appears chronic.  No acute findings  Assessment/Plan of Care: 1.  Acute gout left midfoot x 1 week  -Patient evaluated.  X-rays reviewed -Injection of 0.5 cc Celestone  Soluspan injected into the left tarsometatarsal joint around the 3rd and 4th TMT -Prescription for Medrol  Dosepak -Prescription for meloxicam  15 mg daily after completion  of the Dosepak -RICE - return to clinic PRN     Thresa EMERSON Sar, DPM [facility]  Dr. Thresa EMERSON Sar, DPM    [address] [place], [place] [address]                Office [phone]  Fax [phone]

## 2024-05-06 ENCOUNTER — Encounter: Payer: Self-pay | Admitting: Radiology

## 2024-06-06 ENCOUNTER — Other Ambulatory Visit: Payer: Self-pay

## 2024-06-06 ENCOUNTER — Ambulatory Visit: Admitting: Surgical

## 2024-06-06 DIAGNOSIS — M25512 Pain in left shoulder: Secondary | ICD-10-CM

## 2024-06-06 DIAGNOSIS — M1712 Unilateral primary osteoarthritis, left knee: Secondary | ICD-10-CM

## 2024-06-09 ENCOUNTER — Encounter: Payer: Self-pay | Admitting: Surgical
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
## 2024-07-24 ENCOUNTER — Encounter (HOSPITAL_BASED_OUTPATIENT_CLINIC_OR_DEPARTMENT_OTHER): Payer: Self-pay | Admitting: Physical Therapy

## 2024-07-30 ENCOUNTER — Ambulatory Visit (HOSPITAL_BASED_OUTPATIENT_CLINIC_OR_DEPARTMENT_OTHER): Admitting: Physical Therapy

## 2024-08-05 ENCOUNTER — Ambulatory Visit (HOSPITAL_BASED_OUTPATIENT_CLINIC_OR_DEPARTMENT_OTHER): Admitting: Physical Therapy
